# Patient Record
Sex: Female | Born: 1990 | Race: Black or African American | Hispanic: No | Marital: Single | Smoking: Never smoker
Health system: Southern US, Community
[De-identification: ages and names within clinical notes are randomized; demographics above are authoritative.]

## PROBLEM LIST (undated history)

## (undated) DIAGNOSIS — F458 Other somatoform disorders: Secondary | ICD-10-CM

## (undated) DIAGNOSIS — R1310 Dysphagia, unspecified: Secondary | ICD-10-CM

## (undated) DIAGNOSIS — M1990 Unspecified osteoarthritis, unspecified site: Secondary | ICD-10-CM

## (undated) DIAGNOSIS — R32 Unspecified urinary incontinence: Secondary | ICD-10-CM

## (undated) DIAGNOSIS — R198 Other specified symptoms and signs involving the digestive system and abdomen: Secondary | ICD-10-CM

## (undated) DIAGNOSIS — Z8669 Personal history of other diseases of the nervous system and sense organs: Secondary | ICD-10-CM

## (undated) DIAGNOSIS — K5900 Constipation, unspecified: Secondary | ICD-10-CM

## (undated) DIAGNOSIS — F840 Autistic disorder: Secondary | ICD-10-CM

## (undated) DIAGNOSIS — T8859XA Other complications of anesthesia, initial encounter: Secondary | ICD-10-CM

## (undated) DIAGNOSIS — F79 Unspecified intellectual disabilities: Secondary | ICD-10-CM

## (undated) DIAGNOSIS — J302 Other seasonal allergic rhinitis: Secondary | ICD-10-CM

## (undated) DIAGNOSIS — T4145XA Adverse effect of unspecified anesthetic, initial encounter: Secondary | ICD-10-CM

## (undated) HISTORY — PX: BELPHAROPTOSIS REPAIR: SHX369

## (undated) HISTORY — DX: Autistic disorder: F84.0

## (undated) HISTORY — DX: Unspecified osteoarthritis, unspecified site: M19.90

## (undated) HISTORY — PX: MUSCLE BIOPSY: SHX716

## (undated) HISTORY — PX: WISDOM TOOTH EXTRACTION: SHX21

## (undated) HISTORY — PX: TOENAIL EXCISION: SHX183

## (undated) SURGERY — Surgical Case
Anesthesia: *Unknown

---
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
## 2009-04-28 ENCOUNTER — Ambulatory Visit (HOSPITAL_BASED_OUTPATIENT_CLINIC_OR_DEPARTMENT_OTHER): Admission: RE | Admit: 2009-04-28 | Discharge: 2009-04-28 | Payer: Self-pay | Admitting: Oral Surgery

## 2009-04-28 HISTORY — PX: MULTIPLE TOOTH EXTRACTIONS: SHX2053

## 2009-07-29 ENCOUNTER — Encounter: Admission: RE | Admit: 2009-07-29 | Discharge: 2009-08-27 | Payer: Self-pay | Admitting: Pediatrics

## 2009-10-22 ENCOUNTER — Encounter: Admission: RE | Admit: 2009-10-22 | Discharge: 2009-11-17 | Payer: Self-pay | Admitting: Pediatrics

## 2011-01-10 NOTE — Op Note (Signed)
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   PROCEDURE:  Surgical removal of impacted teeth #1, #17, and #32.   ANESTHESIA:  General.   SURGEON:  Hinton Dyer, DDS   ASSISTANTS:  Montel Culver and Windy Kalata.   ESTIMATED BLOOD LOSS:  Less than 10 mL.   CONDITION IN THE SURGERY:  Good.   Following preoperative medication, the patient was brought to the  operating room in a supine position in which she remained throughout the  whole procedure.  She was intubated via right nasal endotracheal tube.  She was kept as per her physician as an I and O.  Once finished, she was  then prepped and draped in the usual fashion for an intraoral procedure.  The mouth and face were prepped with Betadine scrub and paint.  The  throat was packed off after suctioning it dry.  A bite block was used.  Three carpules of 2% Xylocaine with 1:100,000 epinephrine was given as  bilateral blocks and infiltration of the right maxilla.  Prior to doing  this, a lateral plate of each side of the face was taken so as to  visualize what the impacted teeth looked like.  The 2 lower impacted  teeth were horizontal impactions and the upper wound was a full bony  impaction on the right side.  No tooth was visualized on the upper left  side.  A 15 blade was then made an incision over the left impacted  wisdom tooth with a small release at the distal buccal aspect of tooth  #18.  A full-thickness mucoperiosteal flap was elevated with  a  periosteal elevator.  Occlusal buccal and distal bone was removed with a  round bur and copious irrigation.  The tooth was then suctioned  longitudinally with the same round bur.  It was split with an 11A  elevator.  It was then removed using the same 11A elevator.  The socket  was curetted and irrigated.  The soft tissue was repositioned and closed  with a 3-0 chromic suture.  A 15 blade then made an incision over the  right retromolar pad with a release at the distal buccal aspect of tooth  #31.  A full-thickness mucoperiosteal flap was elevated with a  periosteal elevator.  Occlusal buccal and distal bone was removed with a  round bur and copious irrigation.  The tooth was sectioned  longitudinally with a round bur and copious irrigation.  It was split  with an 11A elevator.  It was then removed with an 11A elevator.  The  socket was curetted and irrigated.  The soft tissue  was repositioned and  closed with a 3-0 chromic suture.  A 15 blade made an incision with the  right tuberosity extending around the tooth #2 tube and a full-thickness  mucoperiosteal flap was elevated with a periosteal elevator.  Occlusal  buccal and distal bone was removed with a rongeur.  The tooth was then  elevated out of the socket using an 11A elevator.  The socket was  curetted and irrigated.  The soft tissue was repositioned and closed  with a 3-0 chromic suture.  The mouth was irrigated out and suctioned.  Throat pack was  removed.  Gauze packs were placed over the surgical sites.  The patient  was extubated on the table and returned to the recovery room in good  condition.  She will be sent home with a prescription for Vicodin x20, 1-  2 q.4 h. p.r.n. pain and written home care and diet instructions.  Return visit 1-week observation.           ______________________________  Hinton Dyer, D.D.S.     JLM/MEDQ  D:  04/28/2009  T:  04/29/2009  Job:  [id number]

## 2011-01-13 NOTE — Procedures (Signed)
REQUESTING PHYSICIAN:  Dr. Ellison Carwin   CLINICAL HISTORY:  [age]-year-old girl with autism and periods of  unresponsiveness.  EEG is performed for evaluation of possible seizures.  This is a routine EEG done with photic stimulation but not hyperventilation.  The patient is described as awake.   DESCRIPTION:  The dominant rhythm of this tracing is an alpha rhythm of 10  Hz which predominates posteriorly and attenuates with eye opening and  closing, the alpha rhythm usually is symmetric in amplitude although  occasionally there is a marked asymmetry in the amplitude of the alpha much  higher on the left than on the right but there is no focal slowing  accompanying this, abundant low amplitude fast activity is seen throughout  the study without abnormal asymmetry.  No focal slowing is noted.  No  epileptiform discharges are seen.  Drowsiness occurs naturally as evidenced  by fragmentation of the background and generalized attenuation of rhythms  but no persistent drowsiness and no findings of stage II sleep are seen.  The patient did not cooperate very well with photic stimulation but there  did appear to be driving responses.  Hyperventilation was not performed.  Single channel devoted to EKG reveals sinus rhythm throughout with a rate of  approximately 96 beats per minute.   CONCLUSION:  Essentially normal study in the awake state.  Occasional  amplitude asymmetry of the background alpha rhythm in the left  frontotemporal area of questionable significance.  No focal slowing, no  epileptiform discharges.      GMW:NUUV  D:  10/11/2004 15:57:10  T:  10/11/2004 19:10:50  Job #:  [id number]

## 2011-07-11 DIAGNOSIS — K5900 Constipation, unspecified: Secondary | ICD-10-CM | POA: Insufficient documentation

## 2011-07-11 DIAGNOSIS — R350 Frequency of micturition: Secondary | ICD-10-CM | POA: Insufficient documentation

## 2011-07-11 DIAGNOSIS — R319 Hematuria, unspecified: Secondary | ICD-10-CM | POA: Insufficient documentation

## 2013-03-26 ENCOUNTER — Encounter: Payer: Self-pay | Admitting: Obstetrics & Gynecology

## 2013-03-26 ENCOUNTER — Ambulatory Visit (INDEPENDENT_AMBULATORY_CARE_PROVIDER_SITE_OTHER): Payer: Medicaid Other | Admitting: Obstetrics & Gynecology

## 2013-03-26 VITALS — BP 143/86 | HR 97 | Temp 97.7°F | Ht 65.0 in | Wt 132.0 lb

## 2013-03-26 DIAGNOSIS — N921 Excessive and frequent menstruation with irregular cycle: Secondary | ICD-10-CM

## 2013-03-26 MED ORDER — ESTRADIOL 0.1 MG/24HR TD PTWK: 1.0000 | MEDICATED_PATCH | TRANSDERMAL | Status: DC

## 2013-03-26 NOTE — Progress Notes (Signed)
Subjective:     Patricia Santos is a 22 y.o. female here for a routine exam.  Current complaints: Pt is currently on the Depo injection. Pt is having spotting while on the Depo. Pt's mother states the spotting starts a month before her next injection is due. Pt's mother states this began in Jan. 2014. Pt has been on the Depo for 8 years.   Personal health questionnaire reviewed: yes.   Gynecologic History No LMP recorded. Patient has had an injection. Contraception: Depo-Provera injections Last Pap: 2013. Results were: normal   Obstetric History OB History   Grav Para Term Preterm Abortions TAB SAB Ect Mult Living                   The following portions of the patient's history were reviewed and updated as appropriate: allergies, current medications, past family history, past medical history, past social history, past surgical history and problem list.  Review of Systems Review of systems not obtained due to patient factors.    Objective:     No exam today     Assessment:    Unscheduled bleeding on Depo provera Cognitive delays, hygiene issues w/menses  Plan:   Climara patch for now Placement of a Mirena IUD under Anesthesia

## 2013-03-26 NOTE — Patient Instructions (Signed)
Levonorgestrel intrauterine device (IUD) What is this medicine? LEVONORGESTREL IUD (LEE voe nor jes trel) is a contraceptive (birth control) device. The device is placed inside the uterus by a healthcare professional. It is used to prevent pregnancy and can also be used to treat heavy bleeding that occurs during your period. Depending on the device, it can be used for 3 to 5 years. This medicine may be used for other purposes; ask your health care provider or pharmacist if you have questions. What should I tell my health care provider before I take this medicine? They need to know if you have any of these conditions: -abnormal Pap smear -cancer of the breast, uterus, or cervix -diabetes -endometritis -genital or pelvic infection now or in the past -have more than one sexual partner or your partner has more than one partner -heart disease -history of an ectopic or tubal pregnancy -immune system problems -IUD in place -liver disease or tumor -problems with blood clots or take blood-thinners -use intravenous drugs -uterus of unusual shape -vaginal bleeding that has not been explained -an unusual or allergic reaction to levonorgestrel, other hormones, silicone, or polyethylene, medicines, foods, dyes, or preservatives -pregnant or trying to get pregnant -breast-feeding How should I use this medicine? This device is placed inside the uterus by a health care professional. Talk to your pediatrician regarding the use of this medicine in children. Special care may be needed. Overdosage: If you think you have taken too much of this medicine contact a poison control center or emergency room at once. NOTE: This medicine is only for you. Do not share this medicine with others. What if I miss a dose? This does not apply. What may interact with this medicine? Do not take this medicine with any of the following medications: -amprenavir -bosentan -fosamprenavir This medicine may also interact with  the following medications: -aprepitant -barbiturate medicines for inducing sleep or treating seizures -bexarotene -griseofulvin -medicines to treat seizures like carbamazepine, ethotoin, felbamate, oxcarbazepine, phenytoin, topiramate -modafinil -pioglitazone -rifabutin -rifampin -rifapentine -some medicines to treat HIV infection like atazanavir, indinavir, lopinavir, nelfinavir, tipranavir, ritonavir -St. John's wort -warfarin This list may not describe all possible interactions. Give your health care provider a list of all the medicines, herbs, non-prescription drugs, or dietary supplements you use. Also tell them if you smoke, drink alcohol, or use illegal drugs. Some items may interact with your medicine. What should I watch for while using this medicine? Visit your doctor or health care professional for regular check ups. See your doctor if you or your partner has sexual contact with others, becomes HIV positive, or gets a sexual transmitted disease. This product does not protect you against HIV infection (AIDS) or other sexually transmitted diseases. You can check the placement of the IUD yourself by reaching up to the top of your vagina with clean fingers to feel the threads. Do not pull on the threads. It is a good habit to check placement after each menstrual period. Call your doctor right away if you feel more of the IUD than just the threads or if you cannot feel the threads at all. The IUD may come out by itself. You may become pregnant if the device comes out. If you notice that the IUD has come out use a backup birth control method like condoms and call your health care provider. Using tampons will not change the position of the IUD and are okay to use during your period. What side effects may I notice from receiving this medicine?   Side effects that you should report to your doctor or health care professional as soon as possible: -allergic reactions like skin rash, itching or  hives, swelling of the face, lips, or tongue -fever, flu-like symptoms -genital sores -high blood pressure -no menstrual period for 6 weeks during use -pain, swelling, warmth in the leg -pelvic pain or tenderness -severe or sudden headache -signs of pregnancy -stomach cramping -sudden shortness of breath -trouble with balance, talking, or walking -unusual vaginal bleeding, discharge -yellowing of the eyes or skin Side effects that usually do not require medical attention (report to your doctor or health care professional if they continue or are bothersome): -acne -breast pain -change in sex drive or performance -changes in weight -cramping, dizziness, or faintness while the device is being inserted -headache -irregular menstrual bleeding within first 3 to 6 months of use -nausea This list may not describe all possible side effects. Call your doctor for medical advice about side effects. You may report side effects to FDA at 1-800-FDA-1088. Where should I keep my medicine? This does not apply. NOTE: This sheet is a summary. It may not cover all possible information. If you have questions about this medicine, talk to your doctor, pharmacist, or health care provider.  2013, Elsevier/Gold Standard. (09/14/2011 1:54:04 PM)  

## 2013-03-28 ENCOUNTER — Encounter: Payer: Self-pay | Admitting: Obstetrics & Gynecology

## 2013-03-28 ENCOUNTER — Other Ambulatory Visit: Payer: Self-pay | Admitting: *Deleted

## 2013-04-14 ENCOUNTER — Encounter (HOSPITAL_COMMUNITY): Payer: Self-pay

## 2013-04-14 NOTE — Progress Notes (Signed)
Pt. has autism mother will accompany her.  Also pt had eyelid surgery and her eyes will not close they will need to be taped

## 2013-04-24 ENCOUNTER — Encounter (HOSPITAL_COMMUNITY): Payer: Self-pay | Admitting: Pharmacy Technician

## 2013-05-02 ENCOUNTER — Ambulatory Visit (HOSPITAL_COMMUNITY): Payer: Medicaid Other | Admitting: Anesthesiology

## 2013-05-02 ENCOUNTER — Ambulatory Visit (HOSPITAL_COMMUNITY)
Admission: RE | Admit: 2013-05-02 | Discharge: 2013-05-02 | Disposition: A | Discharge status: Home / Self Care | Payer: Medicaid Other | Source: Ambulatory Visit | Attending: Obstetrics & Gynecology | Admitting: Obstetrics & Gynecology

## 2013-05-02 ENCOUNTER — Encounter (HOSPITAL_COMMUNITY)
Admission: RE | Disposition: A | Discharge status: Home / Self Care | Payer: Self-pay | Source: Ambulatory Visit | Attending: Obstetrics & Gynecology

## 2013-05-02 ENCOUNTER — Encounter (HOSPITAL_COMMUNITY): Payer: Self-pay | Admitting: Anesthesiology

## 2013-05-02 ENCOUNTER — Encounter (HOSPITAL_COMMUNITY): Payer: Self-pay | Admitting: *Deleted

## 2013-05-02 DIAGNOSIS — Z30430 Encounter for insertion of intrauterine contraceptive device: Secondary | ICD-10-CM | POA: Insufficient documentation

## 2013-05-02 DIAGNOSIS — N939 Abnormal uterine and vaginal bleeding, unspecified: Secondary | ICD-10-CM

## 2013-05-02 DIAGNOSIS — R6250 Unspecified lack of expected normal physiological development in childhood: Secondary | ICD-10-CM

## 2013-05-02 DIAGNOSIS — F840 Autistic disorder: Secondary | ICD-10-CM | POA: Insufficient documentation

## 2013-05-02 HISTORY — PX: INTRAUTERINE DEVICE (IUD) INSERTION: SHX5877

## 2013-05-02 SURGERY — INSERTION, INTRAUTERINE DEVICE
Anesthesia: Monitor Anesthesia Care | Site: Vagina | Wound class: Clean Contaminated

## 2013-05-02 MED ORDER — MIDAZOLAM HCL 5 MG/ML IJ SOLN: 2.0000 mg | Freq: Once | INTRAMUSCULAR | Status: DC

## 2013-05-02 MED ORDER — LACTATED RINGERS IV SOLN
INTRAVENOUS | Status: DC
  Administered 2013-05-02 (×3): via INTRAVENOUS

## 2013-05-02 MED ORDER — FENTANYL CITRATE 0.05 MG/ML IJ SOLN
INTRAMUSCULAR | Status: AC
  Filled 2013-05-02: qty 2

## 2013-05-02 MED ORDER — KETOROLAC TROMETHAMINE 15 MG/ML IJ SOLN
15.0000 mg | Freq: Four times a day (QID) | INTRAMUSCULAR | Status: DC
  Filled 2013-05-02: qty 1

## 2013-05-02 MED ORDER — KETAMINE HCL 10 MG/ML IJ SOLN
INTRAMUSCULAR | Status: DC | PRN
  Administered 2013-05-02: 20 mg via INTRAVENOUS
  Administered 2013-05-02: 10 mg via INTRAVENOUS
  Administered 2013-05-02: 20 mg via INTRAVENOUS
  Administered 2013-05-02: 10 mg via INTRAVENOUS
  Administered 2013-05-02 (×2): 20 mg via INTRAVENOUS

## 2013-05-02 MED ORDER — ACETAMINOPHEN 325 MG PO TABS: 650.0000 mg | ORAL_TABLET | ORAL | Status: DC | PRN

## 2013-05-02 MED ORDER — MIDAZOLAM HCL 2 MG/2ML IJ SOLN
INTRAMUSCULAR | Status: DC | PRN
  Administered 2013-05-02: 2 mg via INTRAVENOUS

## 2013-05-02 MED ORDER — LIDOCAINE HCL (CARDIAC) 20 MG/ML IV SOLN
INTRAVENOUS | Status: AC
  Filled 2013-05-02: qty 5

## 2013-05-02 MED ORDER — ONDANSETRON HCL 4 MG/2ML IJ SOLN
INTRAMUSCULAR | Status: AC
  Filled 2013-05-02: qty 2

## 2013-05-02 MED ORDER — MIDAZOLAM HCL 2 MG/2ML IJ SOLN
1.0000 mg | Freq: Once | INTRAMUSCULAR | Status: AC
  Administered 2013-05-02: 0.5 mg via INTRAVENOUS

## 2013-05-02 MED ORDER — DIAZEPAM 5 MG PO TABS
10.0000 mg | ORAL_TABLET | Freq: Once | ORAL | Status: AC
  Administered 2013-05-02: 10 mg via ORAL

## 2013-05-02 MED ORDER — FENTANYL CITRATE 0.05 MG/ML IJ SOLN: 25.0000 ug | INTRAMUSCULAR | Status: DC | PRN

## 2013-05-02 MED ORDER — MIDAZOLAM HCL 2 MG/2ML IJ SOLN
INTRAMUSCULAR | Status: AC
  Administered 2013-05-02: 1 mg
  Filled 2013-05-02: qty 2

## 2013-05-02 MED ORDER — MIDAZOLAM HCL 2 MG/2ML IJ SOLN
0.5000 mg | Freq: Once | INTRAMUSCULAR | Status: AC
  Administered 2013-05-02: 0.5 mg via INTRAVENOUS

## 2013-05-02 MED ORDER — MIDAZOLAM HCL 2 MG/2ML IJ SOLN
INTRAMUSCULAR | Status: AC
  Administered 2013-05-02: 0.5 mg via INTRAVENOUS
  Filled 2013-05-02: qty 2

## 2013-05-02 MED ORDER — ONDANSETRON HCL 4 MG/2ML IJ SOLN
INTRAMUSCULAR | Status: DC | PRN
  Administered 2013-05-02: 4 mg via INTRAVENOUS

## 2013-05-02 MED ORDER — MIDAZOLAM HCL 2 MG/2ML IJ SOLN
INTRAMUSCULAR | Status: AC
  Filled 2013-05-02: qty 2

## 2013-05-02 MED ORDER — KETAMINE HCL 10 MG/ML IJ SOLN
INTRAMUSCULAR | Status: AC
  Filled 2013-05-02: qty 1

## 2013-05-02 MED ORDER — ACETAMINOPHEN 650 MG RE SUPP
650.0000 mg | RECTAL | Status: DC | PRN
  Filled 2013-05-02: qty 1

## 2013-05-02 MED ORDER — MIDAZOLAM HCL 2 MG/2ML IJ SOLN
0.5000 mg | Freq: Once | INTRAMUSCULAR | Status: AC
  Administered 2013-05-02 (×2): 1 mg via INTRAVENOUS

## 2013-05-02 MED ORDER — OXYCODONE HCL 5 MG PO TABS: 5.0000 mg | ORAL_TABLET | ORAL | Status: DC | PRN

## 2013-05-02 MED ORDER — PROPOFOL 10 MG/ML IV EMUL
INTRAVENOUS | Status: AC
  Filled 2013-05-02: qty 20

## 2013-05-02 MED ORDER — MIDAZOLAM HCL 2 MG/2ML IJ SOLN
INTRAMUSCULAR | Status: AC
  Administered 2013-05-02: 1 mg via INTRAVENOUS
  Filled 2013-05-02: qty 2

## 2013-05-02 MED ORDER — FENTANYL CITRATE 0.05 MG/ML IJ SOLN
INTRAMUSCULAR | Status: DC | PRN
  Administered 2013-05-02: 100 ug via INTRAVENOUS

## 2013-05-02 SURGICAL SUPPLY — 14 items
CATH ROBINSON RED A/P 16FR (CATHETERS) ×2 IMPLANT
CLOTH BEACON ORANGE TIMEOUT ST (SAFETY) ×2 IMPLANT
CONTAINER PREFILL 10% NBF 60ML (FORM) ×4 IMPLANT
GLOVE BIO SURGEON STRL SZ 6.5 (GLOVE) ×4 IMPLANT
GOWN PREVENTION PLUS LG XLONG (DISPOSABLE) ×2 IMPLANT
GOWN STRL REIN XL XLG (GOWN DISPOSABLE) ×2 IMPLANT
NDL SPNL 22GX3.5 QUINCKE BK (NEEDLE) ×1 IMPLANT
NEEDLE SPNL 22GX3.5 QUINCKE BK (NEEDLE) ×2 IMPLANT
PACK VAGINAL MINOR WOMEN LF (CUSTOM PROCEDURE TRAY) ×2 IMPLANT
PAD PREP 24X48 CUFFED NSTRL (MISCELLANEOUS) ×2 IMPLANT
SYR CONTROL 10ML LL (SYRINGE) ×2 IMPLANT
TOWEL OR 17X24 6PK STRL BLUE (TOWEL DISPOSABLE) ×4 IMPLANT
WATER STERILE IRR 1000ML POUR (IV SOLUTION) ×2 IMPLANT
mirena ×1 IMPLANT

## 2013-05-02 NOTE — Anesthesia Preprocedure Evaluation (Signed)
Anesthesia Evaluation  Patient identified by MRN, date of birth, ID band Patient awake    Reviewed: Allergy & Precautions, H&P , Patient's Chart, lab work & pertinent test results, reviewed documented beta blocker date and time   Airway Mallampati: II TM Distance: >3 FB Neck ROM: full    Dental no notable dental hx.    Pulmonary  breath sounds clear to auscultation  Pulmonary exam normal       Cardiovascular Rhythm:regular Rate:Normal     Neuro/Psych    GI/Hepatic   Endo/Other    Renal/GU      Musculoskeletal   Abdominal   Peds  Hematology   Anesthesia Other Findings   Reproductive/Obstetrics                           Anesthesia Physical Anesthesia Plan  ASA: II  Anesthesia Plan: MAC   Post-op Pain Management:    Induction: Intravenous  Airway Management Planned: LMA, Mask and Natural Airway  Additional Equipment:   Intra-op Plan:   Post-operative Plan:   Informed Consent: I have reviewed the patients History and Physical, chart, labs and discussed the procedure including the risks, benefits and alternatives for the proposed anesthesia with the patient or authorized representative who has indicated his/her understanding and acceptance.   Dental Advisory Given  Plan Discussed with: CRNA and Surgeon  Anesthesia Plan Comments:         Anesthesia Quick Evaluation  

## 2013-05-02 NOTE — Anesthesia Postprocedure Evaluation (Signed)
  Anesthesia Post-op Note  Patient: Patricia Santos  Procedure(s) Performed: Procedure(s): INTRAUTERINE DEVICE (IUD) INSERTION (N/A) Patient is awake and responsive. Mother present at bedside. Pain and nausea are reasonably well controlled. Vital signs are stable and clinically acceptable. Oxygen saturation is clinically acceptable. There are no apparent anesthetic complications at this time. Patient is ready for discharge.

## 2013-05-02 NOTE — Transfer of Care (Signed)
Immediate Anesthesia Transfer of Care Note  Patient: Patricia Santos  Procedure(s) Performed: Procedure(s): INTRAUTERINE DEVICE (IUD) INSERTION (N/A)  Patient Location: PACU  Anesthesia Type:MAC  Level of Consciousness: sedated  Airway & Oxygen Therapy: Patient Spontanous Breathing  Post-op Assessment: Report given to PACU RN  Post vital signs: Reviewed and stable  Complications: No apparent anesthesia complications

## 2013-05-02 NOTE — H&P (Signed)
  Chief Complaint: 22 y.o.  who presents for an IUD placement  Details of Present Illness: The patient has significant cognitive delays.  She is currently being managed with Depo provera to induce an amenorrheic state to help with hygiene issues during menses.  She is having bleeding with the Depo provera.  Ht 5\' 4"  (1.626 m)  Wt 130 lb (58.968 kg)  BMI 22.3 kg/m2  Past Medical History  Diagnosis Date  . Autistic disorder   . Seizures     none in 5-6 yrs  . Arthritis     feet   History   Social History  . Marital Status: Single    Spouse Name: N/A    Number of Children: N/A  . Years of Education: N/A   Occupational History  . Not on file.   Social History Main Topics  . Smoking status: Never Smoker   . Smokeless tobacco: Not on file  . Alcohol Use: No  . Drug Use: No  . Sexual Activity: No   Other Topics Concern  . Not on file   Social History Narrative  . No narrative on file   Family History  Problem Relation Age of Onset  . Hyperthyroidism Father   . Hyperthyroidism Maternal Aunt   . Diabetes Maternal Grandmother   . Hypertension Maternal Grandmother     Pertinent items are noted in HPI.  Pre-Op Diagnosis: cognitive delays/hygene issues [id number] modifier 23   Planned Procedure: Procedure(s): INTRAUTERINE DEVICE (IUD) INSERTION, EUA, Pap smear  I have reviewed the patient's history and have completed the physical exam and Patricia Santos is acceptable for surgery.  Roseanna Rainbow, MD 05/02/2013 11:11 AM

## 2013-05-02 NOTE — Op Note (Signed)
IUD Insertion Procedure Note  Pre-operative Diagnosis: Cognitive delays, hygiene issues with menses  Post-operative Diagnosis: same  Indications: See above  Procedure Details  The risks (including infection, bleeding, pain, and uterine perforation) and benefits of the procedure were explained to the patient's guardian and Written informed consent was obtained.   A Pap smear was obtained.  An exam under anesthesia was performed.  The uterus was nonpalpable and there were no palpable adnexal masses. Cervix cleansed with Betadine. Uterus sounded to 6 cm. IUD inserted without difficulty. String visible and trimmed. Patient tolerated procedure well.  IUD Information: Mirena.  Condition: Stable  Complications: None  Plan:  The patient was advised to call for any fever or for prolonged or severe pain or bleeding. She was advised to use NSAID as needed for mild to moderate pain.

## 2013-05-08 ENCOUNTER — Encounter (HOSPITAL_COMMUNITY): Payer: Self-pay | Admitting: Obstetrics & Gynecology

## 2013-06-04 ENCOUNTER — Ambulatory Visit (INDEPENDENT_AMBULATORY_CARE_PROVIDER_SITE_OTHER): Payer: Medicaid Other | Admitting: Obstetrics & Gynecology

## 2013-06-04 ENCOUNTER — Encounter: Payer: Self-pay | Admitting: Obstetrics & Gynecology

## 2013-06-04 VITALS — BP 116/76 | HR 109 | Temp 97.9°F | Wt 135.6 lb

## 2013-06-04 DIAGNOSIS — Z30431 Encounter for routine checking of intrauterine contraceptive device: Secondary | ICD-10-CM

## 2013-06-04 DIAGNOSIS — Z09 Encounter for follow-up examination after completed treatment for conditions other than malignant neoplasm: Secondary | ICD-10-CM

## 2013-06-04 NOTE — Progress Notes (Signed)
Subjective:    Patricia Santos is a 22 y.o. female who presents for f/u s/p Mirena IUD placement Menstrual History: OB History   Grav Para Term Preterm Abortions TAB SAB Ect Mult Living                     The following portions of the patient's history were reviewed and updated as appropriate: allergies, current medications, past family history, past medical history, past social history and past surgical history.  Review of Systems Pertinent items are noted in HPI.   Objective:     No exam today.  Informal TAS: hyperechoic IUD seen     Assessment:    22 y.o. cognitive delays, hygiene issues with menses now s/p Mirena IUD placement  Plan:   F/U prn or in 1 yr

## 2013-08-08 ENCOUNTER — Ambulatory Visit (INDEPENDENT_AMBULATORY_CARE_PROVIDER_SITE_OTHER): Payer: Medicaid Other

## 2013-08-08 VITALS — BP 128/78 | HR 76 | Resp 12 | Ht 64.0 in | Wt 130.0 lb

## 2013-08-08 DIAGNOSIS — M7750 Other enthesopathy of unspecified foot: Secondary | ICD-10-CM

## 2013-08-08 DIAGNOSIS — M2030 Hallux varus (acquired), unspecified foot: Secondary | ICD-10-CM

## 2013-08-08 DIAGNOSIS — R269 Unspecified abnormalities of gait and mobility: Secondary | ICD-10-CM

## 2013-08-08 DIAGNOSIS — M778 Other enthesopathies, not elsewhere classified: Secondary | ICD-10-CM

## 2013-08-08 MED ORDER — DICLOFENAC EPOLAMINE 1.3 % TD PTCH: 1.0000 | MEDICATED_PATCH | Freq: Every day | TRANSDERMAL | Status: DC

## 2013-08-08 NOTE — Patient Instructions (Signed)
Osteoarthritis Osteoarthritis is the most common form of arthritis. It is redness, soreness, and swelling (inflammation) affecting the cartilage. Cartilage acts as a cushion, covering the ends of bones where they meet to form a joint. CAUSES  Over time, the cartilage begins to wear away. This causes bone to rub on bone. This produces pain and stiffness in the affected joints. Factors that contribute to this problem are:  Excessive body weight.  Age.  Overuse of joints. SYMPTOMS   People with osteoarthritis usually experience joint pain, swelling, or stiffness.  Over time, the joint may lose its normal shape.  Small deposits of bone (osteophytes) may grow on the edges of the joint.  Bits of bone or cartilage can break off and float inside the joint space. This may cause more pain and damage.  Osteoarthritis can lead to depression, anxiety, feelings of helplessness, and limitations on daily activities. The most commonly affected joints are in the:  Ends of the fingers.  Thumbs.  Neck.  Lower back.  Knees.  Hips. DIAGNOSIS  Diagnosis is mostly based on your symptoms and exam. Tests may be helpful, including:  X-rays of the affected joint.  A computerized magnetic scan (MRI).  Blood tests to rule out other types of arthritis.  Joint fluid tests. This involves using a needle to draw fluid from the joint and examining the fluid under a microscope. TREATMENT  Goals of treatment are to control pain, improve joint function, maintain a normal body weight, and maintain a healthy lifestyle. Treatment approaches may include:  A prescribed exercise program with rest and joint relief.  Weight control with nutritional education.  Pain relief techniques such as:  Properly applied heat and cold.  Electric pulses delivered to nerve endings under the skin (transcutaneous electrical nerve stimulation, TENS).  Massage.  Certain supplements. Ask your caregiver before using any  supplements, especially in combination with prescribed drugs.  Medicines to control pain, such as:  Acetaminophen.  Nonsteroidal anti-inflammatory drugs (NSAIDs), such as naproxen.  Narcotic or central-acting agents, such as tramadol. This drug carries a risk of addiction and is generally prescribed for short-term use.  Corticosteroids. These can be given orally or as injection. This is a short-term treatment, not recommended for routine use.  Surgery to reposition the bones and relieve pain (osteotomy) or to remove loose pieces of bone and cartilage. Joint replacement may be needed in advanced states of osteoarthritis. HOME CARE INSTRUCTIONS  Your caregiver can recommend specific types of exercise. These may include:  Strengthening exercises. These are done to strengthen the muscles that support joints affected by arthritis. They can be performed with weights or with exercise bands to add resistance.  Aerobic activities. These are exercises, such as brisk walking or low-impact aerobics, that get your heart pumping. They can help keep your lungs and circulatory system in shape.  Range-of-motion activities. These keep your joints limber.  Balance and agility exercises. These help you maintain daily living skills. Learning about your condition and being actively involved in your care will help improve the course of your osteoarthritis. SEEK MEDICAL CARE IF:   You feel hot or your skin turns red.  You develop a rash in addition to your joint pain.  You have an oral temperature above 102 F (38.9 C). FOR MORE INFORMATION  National Institute of Arthritis and Musculoskeletal and Skin Diseases: www.niams.nih.gov National Institute on Aging: www.nia.nih.gov American College of Rheumatology: www.rheumatology.org Document Released: 08/14/2005 Document Revised: 11/06/2011 Document Reviewed: 11/25/2009 Exit[employer] Patient Information 2014 [employer].  

## 2013-08-08 NOTE — Progress Notes (Signed)
   Subjective:    Patient ID: Patricia Santos, female    DOB: 01-17-1991, 22 y.o.   MRN: [id number]  Foot Pain This is a chronic problem. The current episode started more than 1 year ago. The problem occurs daily. The problem has been unchanged. Associated symptoms include weakness. The symptoms are aggravated by walking and standing. Treatments tried: PATCH.      Review of Systems  Neurological: Positive for weakness.  All other systems reviewed and are negative.       Objective:   Physical Exam Neurovascular status is intact patient does have some spasm secondary CP. Pedal pulses are palpable bilateral no edema rubor pallor noted patient is discomfort on dorsiflexion plantarflexion and digits which are contracted has hallux malleus contractures well and gait abnormality with spastic contractures of the ankles. Currently is wearing and athletic shoes and not utilizing bracing. Continues to have diffuse pain capsulitis metatarsal area as well as mid tarsus and Lisfranc area. Significant for 3 changes of the foot noted with has valgus deformity and pes planus. No other findings noted skin color pigment and hair growth are normal no open sores or ulcerations noted. No other medical difficulties any changes no GI problems has been using factor patches successfully and recommend continued use of flexor patch for mild os arthritic pain.       Assessment & Plan:  Assessment abnormality gait osteoarthropathy and neuropathy secondary CP there is capsulitis and digital contractures of toes and hallux bilateral. Plan at this time dispensed reordered 4 Rx of flexor patch PT apply half attached each foot for 12 hours a day reappointed 12 months for long-term followup. Also maintain good lace up or Oxford type shoe wear at all times. Next  Alvan Dame DPM

## 2013-08-18 ENCOUNTER — Telehealth: Payer: Self-pay | Admitting: *Deleted

## 2013-08-18 NOTE — Telephone Encounter (Signed)
I informed pt's mtr,prior authorization had been received for the Flector 1.3 % patch - ZO#[id number].

## 2014-04-03 ENCOUNTER — Encounter (HOSPITAL_BASED_OUTPATIENT_CLINIC_OR_DEPARTMENT_OTHER): Payer: Self-pay | Admitting: *Deleted

## 2014-04-08 ENCOUNTER — Ambulatory Visit (HOSPITAL_BASED_OUTPATIENT_CLINIC_OR_DEPARTMENT_OTHER): Payer: Medicaid Other | Admitting: Certified Registered"

## 2014-04-08 ENCOUNTER — Ambulatory Visit (HOSPITAL_BASED_OUTPATIENT_CLINIC_OR_DEPARTMENT_OTHER)
Admission: RE | Admit: 2014-04-08 | Discharge: 2014-04-08 | Disposition: A | Discharge status: Home / Self Care | Payer: Medicaid Other | Source: Ambulatory Visit | Attending: Pediatric Dentistry | Admitting: Pediatric Dentistry

## 2014-04-08 ENCOUNTER — Encounter (HOSPITAL_BASED_OUTPATIENT_CLINIC_OR_DEPARTMENT_OTHER)
Admission: RE | Disposition: A | Discharge status: Home / Self Care | Payer: Self-pay | Source: Ambulatory Visit | Attending: Pediatric Dentistry

## 2014-04-08 ENCOUNTER — Encounter (HOSPITAL_BASED_OUTPATIENT_CLINIC_OR_DEPARTMENT_OTHER): Payer: Self-pay

## 2014-04-08 ENCOUNTER — Encounter (HOSPITAL_BASED_OUTPATIENT_CLINIC_OR_DEPARTMENT_OTHER): Payer: Medicaid Other | Admitting: Certified Registered"

## 2014-04-08 DIAGNOSIS — K029 Dental caries, unspecified: Secondary | ICD-10-CM | POA: Insufficient documentation

## 2014-04-08 HISTORY — DX: Other seasonal allergic rhinitis: J30.2

## 2014-04-08 HISTORY — DX: Other somatoform disorders: F45.8

## 2014-04-08 HISTORY — DX: Adverse effect of unspecified anesthetic, initial encounter: T41.45XA

## 2014-04-08 HISTORY — DX: Other complications of anesthesia, initial encounter: T88.59XA

## 2014-04-08 HISTORY — DX: Unspecified intellectual disabilities: F79

## 2014-04-08 HISTORY — PX: TOOTH EXTRACTION: SHX859

## 2014-04-08 HISTORY — DX: Dysphagia, unspecified: R13.10

## 2014-04-08 HISTORY — DX: Constipation, unspecified: K59.00

## 2014-04-08 HISTORY — DX: Personal history of other diseases of the nervous system and sense organs: Z86.69

## 2014-04-08 HISTORY — DX: Other specified symptoms and signs involving the digestive system and abdomen: R19.8

## 2014-04-08 SURGERY — DENTAL RESTORATION/EXTRACTIONS
Anesthesia: General | Site: Mouth

## 2014-04-08 MED ORDER — MIDAZOLAM HCL 2 MG/2ML IJ SOLN
INTRAMUSCULAR | Status: AC
  Filled 2014-04-08: qty 2

## 2014-04-08 MED ORDER — PROPOFOL 10 MG/ML IV EMUL
INTRAVENOUS | Status: AC
  Filled 2014-04-08: qty 50

## 2014-04-08 MED ORDER — LACTATED RINGERS IV SOLN
INTRAVENOUS | Status: DC
  Administered 2014-04-08 (×2): via INTRAVENOUS

## 2014-04-08 MED ORDER — PROPOFOL 10 MG/ML IV BOLUS
INTRAVENOUS | Status: DC | PRN
  Administered 2014-04-08: 20 mg via INTRAVENOUS
  Administered 2014-04-08: 30 mg via INTRAVENOUS
  Administered 2014-04-08 (×3): 100 mg via INTRAVENOUS

## 2014-04-08 MED ORDER — MIDAZOLAM HCL 2 MG/ML PO SYRP: 12.0000 mg | ORAL_SOLUTION | Freq: Once | ORAL | Status: DC | PRN

## 2014-04-08 MED ORDER — DEXAMETHASONE SODIUM PHOSPHATE 4 MG/ML IJ SOLN
INTRAMUSCULAR | Status: DC | PRN
  Administered 2014-04-08: 10 mg via INTRAVENOUS

## 2014-04-08 MED ORDER — ONDANSETRON HCL 4 MG/2ML IJ SOLN
INTRAMUSCULAR | Status: DC | PRN
  Administered 2014-04-08: 4 mg via INTRAVENOUS

## 2014-04-08 MED ORDER — LIDOCAINE-EPINEPHRINE 2 %-1:100000 IJ SOLN
INTRAMUSCULAR | Status: DC | PRN
  Administered 2014-04-08: 1 mL

## 2014-04-08 MED ORDER — MIDAZOLAM HCL 5 MG/5ML IJ SOLN
INTRAMUSCULAR | Status: DC | PRN
  Administered 2014-04-08: 2 mg via INTRAVENOUS

## 2014-04-08 MED ORDER — EPHEDRINE SULFATE 50 MG/ML IJ SOLN
INTRAMUSCULAR | Status: DC | PRN
  Administered 2014-04-08: 10 mg via INTRAVENOUS

## 2014-04-08 MED ORDER — FENTANYL CITRATE 0.05 MG/ML IJ SOLN
INTRAMUSCULAR | Status: DC | PRN
  Administered 2014-04-08 (×4): 25 ug via INTRAVENOUS

## 2014-04-08 MED ORDER — FENTANYL CITRATE 0.05 MG/ML IJ SOLN
INTRAMUSCULAR | Status: AC
  Filled 2014-04-08: qty 4

## 2014-04-08 MED ORDER — FENTANYL CITRATE 0.05 MG/ML IJ SOLN: 50.0000 ug | INTRAMUSCULAR | Status: DC | PRN

## 2014-04-08 MED ORDER — MIDAZOLAM HCL 2 MG/2ML IJ SOLN: 1.0000 mg | INTRAMUSCULAR | Status: DC | PRN

## 2014-04-08 MED ORDER — PROPOFOL 10 MG/ML IV BOLUS
INTRAVENOUS | Status: AC
  Filled 2014-04-08: qty 20

## 2014-04-08 SURGICAL SUPPLY — 22 items
BANDAGE COBAN STERILE 2 (GAUZE/BANDAGES/DRESSINGS) IMPLANT
BANDAGE EYE OVAL (MISCELLANEOUS) ×2 IMPLANT
BLADE SURG 15 STRL LF DISP TIS (BLADE) IMPLANT
BLADE SURG 15 STRL SS (BLADE)
BNDG CONFORM 2 STRL LF (GAUZE/BANDAGES/DRESSINGS) ×2 IMPLANT
BRR OPER DNTL INFCT CNTRL SYR (MISCELLANEOUS) ×1
CANISTER SUCT 1200ML W/VALVE (MISCELLANEOUS) ×2 IMPLANT
CATH ROBINSON RED A/P 10FR (CATHETERS) IMPLANT
CATH ROBINSON RED A/P 8FR (CATHETERS) IMPLANT
COVER MAYO STAND STRL (DRAPES) ×2 IMPLANT
COVER SLEEVE SYR LF (MISCELLANEOUS) ×2 IMPLANT
COVER SURGICAL LIGHT HANDLE (MISCELLANEOUS) ×2 IMPLANT
GLOVE BIO SURGEON STRL SZ 6 (GLOVE) IMPLANT
GLOVE BIO SURGEON STRL SZ 6.5 (GLOVE) ×2 IMPLANT
GLOVE BIO SURGEON STRL SZ7.5 (GLOVE) ×3 IMPLANT
SLEEVE SCD COMPRESS KNEE MED (MISCELLANEOUS) ×1 IMPLANT
SUCTION FRAZIER TIP 10 FR DISP (SUCTIONS) IMPLANT
TOWEL OR 17X24 6PK STRL BLUE (TOWEL DISPOSABLE) ×2 IMPLANT
TUBE CONNECTING 20X1/4 (TUBING) ×2 IMPLANT
WATER STERILE IRR 1000ML POUR (IV SOLUTION) ×2 IMPLANT
WATER TABLETS ICX (MISCELLANEOUS) ×2 IMPLANT
YANKAUER SUCT BULB TIP NO VENT (SUCTIONS) ×2 IMPLANT

## 2014-04-08 NOTE — Anesthesia Postprocedure Evaluation (Signed)
  Anesthesia Post-op Note  Patient: Patricia Santos  Procedure(s) Performed: Procedure(s): DENTAL RESTORATION/EXTRACTIONS (N/A)  Patient Location: PACU  Anesthesia Type:General  Level of Consciousness: awake  Airway and Oxygen Therapy: Patient Spontanous Breathing  Post-op Pain: mild  Post-op Assessment: Post-op Vital signs reviewed, Patient's Cardiovascular Status Stable, Respiratory Function Stable, Patent Airway, No signs of Nausea or vomiting and Pain level controlled  Post-op Vital Signs: Reviewed and stable  Last Vitals:  Filed Vitals:   04/08/14 1115  BP: 118/68  Pulse: 98  Temp: 36.4 C  Resp: 16    Complications: No apparent anesthesia complications

## 2014-04-08 NOTE — Discharge Instructions (Signed)
The following instructions have been prepared to help you care for yourself upon your return home today.  Medications: Some soreness and discomfort is normal following a dental procedure. Use of a non-aspirin pain product is recommended. If pain is not relieved, please call the dentist who performed the procedure.  Oral Hygiene: Brushing of the teeth should be resumed the day after surgery. Begin slowly and softly. In children, brushing should be done by the parent after every meal.  Diet: A balanced diet is very important during the healing process. Liquids and soft foods are advisable. Drink clear liquids at first, then progress to other liquids as tolerated. If teeth were removed, do not use a straw for at least 2 days. Try to limit between meal sugar snacks. . Some bleeding is expected at extraction site.  Have child bite on gauze and hold pressure in area until it stops.  No drinking through a straw for 24hrs.  Activity: Limited to quiet indoor activities for 24 hours following surgery.  Return to school or work:In a day or two                                         Call your doctor if any of these occur: Temperature is 101 degrees or more.                                                               Persistent bright red bleeding.                                                               Severe pain.  Return to Office: Call to set up appointment:          Post Anesthesia Home Care Instructions  Activity: Get plenty of rest for the remainder of the day. A responsible adult should stay with you for 24 hours following the procedure.  For the next 24 hours, DO NOT: -Drive a car -Paediatric nurse -Drink alcoholic beverages -Take any medication unless instructed by your physician -Make any legal decisions or sign important papers.  Meals: Start with liquid foods such as gelatin or soup. Progress to regular foods as tolerated. Avoid greasy, spicy, heavy foods. If  nausea and/or vomiting occur, drink only clear liquids until the nausea and/or vomiting subsides. Call your physician if vomiting continues.  Special Instructions/Symptoms: Your throat may feel dry or sore from the anesthesia or the breathing tube placed in your throat during surgery. If this causes discomfort, gargle with warm salt water. The discomfort should disappear within 24 hours.

## 2014-04-08 NOTE — Brief Op Note (Addendum)
04/08/2014  10:43 AM  PATIENT:  Patricia Santos  23 y.o. female  PRE-OPERATIVE DIAGNOSIS:  dental caries  POST-OPERATIVE DIAGNOSIS:  dental caries  PROCEDURE:  Procedure(s): DENTAL RESTORATION/EXTRACTIONS (N/A)  SURGEON:  Surgeon(s) and Role:    * Animator, DDS - Primary  PHYSICIAN ASSISTANT:   ASSISTANTS: Mariane Duval, Alda Ponder   ANESTHESIA:   general  EBL:  Total I/O In: 1200 [I.V.:1200] Out: -   BLOOD ADMINISTERED:none  DRAINS: none   LOCAL MEDICATIONS USED:  LIDOCAINE   SPECIMEN:  Source of Specimen:  one tooth for count only  DISPOSITION OF SPECIMEN:  One tooth for count only given to mother  COUNTS:  YES  TOURNIQUET:  * No tourniquets in log *  DICTATION: .Other Dictation: Dictation Number [id number]  PLAN OF CARE: Discharge to home after PACU  PATIENT DISPOSITION:  PACU - hemodynamically stable.   Delay start of Pharmacological VTE agent (>24hrs) due to surgical blood loss or risk of bleeding: not applicable

## 2014-04-08 NOTE — H&P (Signed)
  Physical by general Physician and is in chart.  Reviewed allergies and answered parent questions.

## 2014-04-08 NOTE — Transfer of Care (Signed)
Immediate Anesthesia Transfer of Care Note  Patient: Patricia Santos  Procedure(s) Performed: Procedure(s): DENTAL RESTORATION/EXTRACTIONS (N/A)  Patient Location: PACU  Anesthesia Type:General  Level of Consciousness: awake, alert  and oriented  Airway & Oxygen Therapy: Patient Spontanous Breathing and Patient connected to face mask oxygen  Post-op Assessment: Report given to PACU RN and Post -op Vital signs reviewed and stable  Post vital signs: Reviewed and stable  Complications: No apparent anesthesia complications

## 2014-04-08 NOTE — Anesthesia Procedure Notes (Signed)
Procedure Name: Intubation Date/Time: 04/08/2014 8:38 AM Performed by: Maryella Shivers Pre-anesthesia Checklist: Patient identified, Emergency Drugs available, Suction available and Patient being monitored Patient Re-evaluated:Patient Re-evaluated prior to inductionOxygen Delivery Method: Circle System Utilized Preoxygenation: Pre-oxygenation with 100% oxygen Intubation Type: IV induction Ventilation: Mask ventilation without difficulty Laryngoscope Size: Mac and 3 Grade View: Grade I Nasal Tubes: Nasal prep performed, Nasal Rae and Right Tube size: 6.5 mm Number of attempts: 1 Placement Confirmation: ETT inserted through vocal cords under direct vision,  positive ETCO2 and breath sounds checked- equal and bilateral Secured at: 27 cm Tube secured with: Tape Dental Injury: Teeth and Oropharynx as per pre-operative assessment

## 2014-04-08 NOTE — Anesthesia Preprocedure Evaluation (Signed)
Anesthesia Evaluation  Patient identified by MRN, date of birth, ID band Patient awake    Reviewed: Allergy & Precautions, H&P , NPO status , Patient's Chart, lab work & pertinent test results  History of Anesthesia Complications Negative for: history of anesthetic complications  Airway Mallampati: I TM Distance: >3 FB Neck ROM: Full    Dental  (+) Teeth Intact   Pulmonary neg pulmonary ROS,  breath sounds clear to auscultation        Cardiovascular negative cardio ROS  Rhythm:Regular     Neuro/Psych autismnegative neurological ROS     GI/Hepatic negative GI ROS, Neg liver ROS,   Endo/Other  negative endocrine ROS  Renal/GU negative Renal ROS     Musculoskeletal   Abdominal   Peds  Hematology negative hematology ROS (+)   Anesthesia Other Findings   Reproductive/Obstetrics                           Anesthesia Physical Anesthesia Plan  ASA: II  Anesthesia Plan: General   Post-op Pain Management:    Induction: Intravenous  Airway Management Planned: Oral ETT and Nasal ETT  Additional Equipment: None  Intra-op Plan:   Post-operative Plan: Extubation in OR  Informed Consent: I have reviewed the patients History and Physical, chart, labs and discussed the procedure including the risks, benefits and alternatives for the proposed anesthesia with the patient or authorized representative who has indicated his/her understanding and acceptance.   Dental advisory given  Plan Discussed with: CRNA and Surgeon  Anesthesia Plan Comments:         Anesthesia Quick Evaluation

## 2014-04-09 ENCOUNTER — Encounter (HOSPITAL_BASED_OUTPATIENT_CLINIC_OR_DEPARTMENT_OTHER): Payer: Self-pay | Admitting: Pediatric Dentistry

## 2014-04-09 NOTE — Op Note (Signed)
Patricia Santos, Patricia Santos              ACCOUNT NO.:  [id number]  MEDICAL RECORD NO.:  [id number]  LOCATION:                                 FACILITY:  PHYSICIAN:  [place]:  10/13/1990  DATE OF PROCEDURE:  04/08/2014 DATE OF DISCHARGE:  04/08/2014                              OPERATIVE REPORT   PREOPERATIVE DIAGNOSIS:  A well-child acute anxiety reaction to dental Treatment with autism.  POSTOPERATIVE DIAGNOSIS:  A well-child acute anxiety reaction to dental treatment with autism.  PROCEDURE PERFORMED:  Full-mouth dental rehabilitation.  SURGEONS:  Doroteo Glassman, DDS, MS.  ASSISTANT:  Mariane Duval and Alda Ponder.  SPECIMENS:  One tooth for count only given to mother.  DRAINS:  None.  CULTURES:  None.  BLOOD LOSS:  Less than 5 mL.  DESCRIPTION OF PROCEDURE:  The patient had IV started in the preoperative area.  The patient then received 2 mL of Versed through the IV and then 100 mL of propofol in the IV.  The patient was then brought from the preoperative area to the operating room, and was placed in a supine position on the operating table.  General anesthesia was induced by mask.  Direct nasoendotracheal intubation was established with a size 6.5 nasal Rae tube.  The head was stabilized and the eyes were protected with lubricant eye pads.  The table was turned 90 degrees.  Four intraoral radiographs were obtained.  A throat pack was placed.  The treatment plan was confirmed and the dental treatment began at 8:52 a.m. The dental arches were isolated with the rubber dam and the following teeth were restored.  Tooth #2, an occlusal composite resin.  Tooth #18, sealant.  Tooth #20, an occlusal composite.  Tooth #21, an occlusal composite.  Tooth #28, an occlusal composite.  Tooth #29, an occlusal composite.  Tooth #31, an occlusal composite.  The rubber dam was removed and the mouth was thoroughly irrigated.  Tooth #15, had large caries to the  pulp.  Due to the patient's autism and being unable to place a crown in the office, the mother was called and asked in the waiting room if extraction was okay.  The mother Laurie Panda the extraction.  Tooth #16, the 3rd molar was erupting and will hopefully take the place of tooth #15 that was removed with forceps with 1.0 mL of 2% lidocaine with 1:100,000 epinephrine.  The mouth was thoroughly irrigated.  The throat pack was removed and the throat was suctioned.  The patient was extubated in the operating room.  The end of the dental treatment was at 10:27 a.m. The patient tolerated the procedures well and was taken to the PACU in stable condition with IV in place.     Doroteo Glassman, D.D.S.     [place]/MEDQ  D:  04/08/2014  T:  04/08/2014  Job:  [id number]

## 2014-08-09 ENCOUNTER — Other Ambulatory Visit: Payer: Self-pay

## 2014-08-14 ENCOUNTER — Ambulatory Visit: Payer: Medicaid Other

## 2014-08-18 ENCOUNTER — Ambulatory Visit (INDEPENDENT_AMBULATORY_CARE_PROVIDER_SITE_OTHER): Payer: Medicaid Other

## 2014-08-18 VITALS — BP 121/76 | HR 86 | Resp 12 | Ht 63.0 in | Wt 143.0 lb

## 2014-08-18 DIAGNOSIS — M778 Other enthesopathies, not elsewhere classified: Secondary | ICD-10-CM

## 2014-08-18 DIAGNOSIS — L600 Ingrowing nail: Secondary | ICD-10-CM

## 2014-08-18 DIAGNOSIS — R269 Unspecified abnormalities of gait and mobility: Secondary | ICD-10-CM

## 2014-08-18 DIAGNOSIS — M779 Enthesopathy, unspecified: Principal | ICD-10-CM

## 2014-08-18 DIAGNOSIS — M7750 Other enthesopathy of unspecified foot: Secondary | ICD-10-CM

## 2014-08-18 DIAGNOSIS — M2040 Other hammer toe(s) (acquired), unspecified foot: Secondary | ICD-10-CM

## 2014-08-18 DIAGNOSIS — M2030 Hallux varus (acquired), unspecified foot: Secondary | ICD-10-CM

## 2014-08-18 MED ORDER — DICLOFENAC EPOLAMINE 1.3 % TD PTCH: MEDICATED_PATCH | TRANSDERMAL | Status: DC

## 2014-08-18 NOTE — Progress Notes (Signed)
   Subjective:    Patient ID: Patricia Santos, female    DOB: [birth year], 23 y.o.   MRN: [id number]  HPI Comments: Pt's mtr states the pt continues to get good pain relief using the Flextor patches, without any complications.  Pt's mtr states the pt does complain of painful toenails B/L medial 1st toenail borders, on and off.  Foot Pain Associated symptoms include congestion and headaches.      Review of Systems  HENT: Positive for congestion.   Eyes: Positive for itching.  Allergic/Immunologic: Positive for food allergies.       Bananas and milk products  Neurological: Positive for headaches.       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 Is intact neurovascular status with pedal pulses palpable DP and PT +2 over 4 Refill time 3 seconds all digits epicritic and proprioceptive sensations intact although somewhat difficult to ascertain with patient's complaints patient does have some widening and splaying of the hallux nail plate try to cut the corners nails may be cut to short to cutting the nail straight across rather than curving in the course to avoid ingrowing nail or brachial nail borders.       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 We'll continue in refill prescription for Flector patches issued at this time  Patient also has mother presented a concern about occasional tenderness in the nail borders however nails are cut too short and rounding a corner is advised left the nails grow straight across and avoiding cutting tight in the corners  along throughout slightly if they continue be persistently problem may be candidate for future partial nail excision based on progress suggest a 1 year annual checkup  Harriet Masson DPM

## 2014-08-18 NOTE — Patient Instructions (Signed)
ICE INSTRUCTIONS  Apply ice or cold pack to the affected area at least 3 times a day for 10-15 minutes each time.  You should also use ice after prolonged activity or vigorous exercise.  Do not apply ice longer than 20 minutes at one time.  Always keep a cloth between your skin and the ice pack to prevent burns.  Being consistent and following these instructions will help control your symptoms.  We suggest you purchase a gel ice pack because they are reusable and do bit leak.  Some of them are designed to wrap around the area.  Use the method that works best for you.  Here are some other suggestions for icing.   Use a frozen bag of peas or corn-inexpensive and molds well to your body, usually stays frozen for 10 to 20 minutes.  Wet a towel with cold water and squeeze out the excess until it's damp.  Place in a bag in the freezer for 20 minutes. Then remove and use.  Continue to maintain Flector patch on an as-needed basis for her generalize forefoot pain. Apply one half patch each foot for 12 hours a day as instructed

## 2014-08-27 ENCOUNTER — Telehealth: Payer: Self-pay

## 2014-08-27 NOTE — Telephone Encounter (Signed)
Pt mother called needing a pre-auth on her flector patch, states the pharmacy cannot fill the Rx with a pre-auth

## 2014-09-01 NOTE — Telephone Encounter (Signed)
I called and informed patient's mother that we are waiting on a response from Medicaid in regards to authorization for [facility] medicine.  They said it could take up to 5-7 days for [place] to get a response.

## 2014-09-08 ENCOUNTER — Telehealth: Payer: Self-pay | Admitting: *Deleted

## 2014-09-08 NOTE — Telephone Encounter (Signed)
I called and left her a message that I called and started the process with [facility] [employer].  I had sent 2 previous request via Cover My Meds and they informed me that they do not do authorizations from Cover My Meds.  They said they just throw them in the trash.  He informed me he was going to have to send it to a pharmacist to determine if it is covered.  I will call you when we get their response.  Sorry for any inconvenience this may have caused you.

## 2014-09-08 NOTE — Telephone Encounter (Signed)
Pt's mtr states pt received rx for Flector patch 4 weeks ago, and [facility] [address] states it must be pre-certified.

## 2014-09-08 NOTE — Telephone Encounter (Signed)
"  This is Patricia Santos, I'm calling from [place].  Calling in regards to patient's authorization for Flector Patch.  We just spoke to [facility] and they are saying they have not received anything for this patient.  They said the last time they received something was in December."  I told him I started the process on 08/28/2015 with Cover My Meds and sent it again on 08/31/2014.  I told him I would call and see what the problem is.  I called [facility] [employer] in regards to the authorization request, spoke to [place].  I answered all his clinical questions.  He said he would have to send it to a pharmacist for further review.  He also stated that [place] [employer] does not accept authorization requests from Cover My Meds.  He said they were told to throw them away.  He gave me a case number of [id number].  Told me to call on tomorrow to check on the status.

## 2014-09-08 NOTE — Telephone Encounter (Signed)
-----   Message from [facility] sent at 09/08/2014 11:24 AM EST ----- Regarding: rx Josem Kaufmann Contact: [phone] PTS MOM CALLED REQUARDING PTS RX AUTH FROM LAST WEEK WITH DR Blenda Mounts. SHE WAS ALSO GOT THE PHARMACY CALLING ALSO. PLEASE LET HER KNOW WHAT IS GOING ON. HER NAME IS Patricia Santos

## 2014-11-05 ENCOUNTER — Telehealth: Payer: Self-pay | Admitting: *Deleted

## 2014-11-05 NOTE — Telephone Encounter (Signed)
"  This is Ashle's mother returning your call."  I was just calling to follow-up.  Did she get her Flector Patch?  "Yes, she did!"  Okay, I never heard from [place] so I just wanted to make sure.  "Yes, she got it and has been using it.  Thank you so much."

## 2014-11-05 NOTE — Telephone Encounter (Signed)
I left patient's mother a message that I was following up about the prescription.  Scientist, forensic)

## 2015-04-23 ENCOUNTER — Telehealth: Payer: Self-pay | Admitting: *Deleted

## 2015-04-23 MED ORDER — DICLOFENAC EPOLAMINE 1.3 % TD PTCH: MEDICATED_PATCH | TRANSDERMAL | Status: DC

## 2015-04-23 NOTE — Telephone Encounter (Signed)
I contacted [facility] and Lissa Merlin states will refill the remaining 2 Flector patch rx for pt until the new rx is prior authorized.  Informed pt's mother.

## 2015-04-26 MED ORDER — DICLOFENAC EPOLAMINE 1.3 % TD PTCH: MEDICATED_PATCH | TRANSDERMAL | Status: DC

## 2015-04-26 NOTE — Telephone Encounter (Addendum)
Prior Authorization for Flector 1.3% patch apply 1/2 patch each foot for 12 hours per day as needed for pain +refill prn 1 year received from [facility] - BX#[id number].  I informed pt's mtr of the prior approval, also made certain the rx was to be called to [facility] at [address] as was changed to on Friday 04/23/2015.  Patricia Santos confirmed change to [employer].  Faxed to [phone].

## 2015-08-16 ENCOUNTER — Ambulatory Visit (INDEPENDENT_AMBULATORY_CARE_PROVIDER_SITE_OTHER): Payer: Medicaid Other

## 2015-08-16 ENCOUNTER — Encounter: Payer: Self-pay | Admitting: Podiatry

## 2015-08-16 ENCOUNTER — Ambulatory Visit (INDEPENDENT_AMBULATORY_CARE_PROVIDER_SITE_OTHER): Payer: Medicaid Other | Admitting: Podiatry

## 2015-08-16 VITALS — BP 129/85 | HR 94 | Resp 16

## 2015-08-16 DIAGNOSIS — L600 Ingrowing nail: Secondary | ICD-10-CM | POA: Diagnosis not present

## 2015-08-16 DIAGNOSIS — M2040 Other hammer toe(s) (acquired), unspecified foot: Secondary | ICD-10-CM

## 2015-08-16 DIAGNOSIS — R269 Unspecified abnormalities of gait and mobility: Secondary | ICD-10-CM

## 2015-08-16 DIAGNOSIS — R261 Paralytic gait: Secondary | ICD-10-CM

## 2015-08-16 DIAGNOSIS — M79671 Pain in right foot: Secondary | ICD-10-CM

## 2015-08-16 DIAGNOSIS — M79672 Pain in left foot: Principal | ICD-10-CM

## 2015-08-16 DIAGNOSIS — M2030 Hallux varus (acquired), unspecified foot: Secondary | ICD-10-CM

## 2015-08-17 ENCOUNTER — Ambulatory Visit: Payer: Medicaid Other

## 2015-08-20 NOTE — Progress Notes (Signed)
Patient ID: Patricia Santos, female   DOB: 02-09-1991, 24 y.o.   MRN: [id number]  Subjective: 24 year old female presents the office they with her mom for 1 year follow-up of osteoarthritis and pain to her feet. She states that she occasions states that she's having some pain to her feet but she is unable to verbalize where her pain is coming from. She does use diclofenac patches which seems to help. Her mom and also to have the toe strength ingrown toenails as they frequently ingrown and that also causes pain. Denied any surrounding redness or drainage. The mother states that she keeps he did have AFO braces and she would maybe like to try this again. Denies any systemic complaints such as fevers, chills, nausea, vomiting. No acute changes since last appointment, and no other complaints at this time.   Objective: Awake, alert, NAD DP/PT pulses palpable bilaterally, CRT less than 3 seconds Protective sensation appears to be intact with Derrel Nip monofilament There is spastic contractures of the toes incline of the digits which appears be unchanged compared to last appointment. There is no specific area pinpoint bony tenderness or pain the vibratory sensation that I can elicit at today's appointment. There is no overlying edema, erythema, increase in warmth. There does appear to be some incurvation of both the medial and lateral aspects of bilateral hallux toenail the ascending edema, erythema, drainage or other signs of infection. No edema, erythema, increase in warmth to bilateral lower extremities.  No open lesions or pre-ulcerative lesions.  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  -All treatment options discussed with the patient including  all alternatives, risks, complications.  -She may benefit from an AFO/orthotic. A prescription for [facility] was provided today as they have gone there previously had good results. -Continue to monitor his sutures any specific area of tenderness that she is complaining about however was unable to elicit any pain today. -Bilateral hallux nails are sharply debrided without complication/bleeding turned with symptomatic portion the ingrown toenail. -Follow-up after AFO/orthotic or sooner if any problems are to arise. -Patient encouraged to call the office with any questions, concerns, change in symptoms.   Celesta Gentile, DPM

## 2015-10-07 ENCOUNTER — Encounter (HOSPITAL_COMMUNITY): Payer: Self-pay | Admitting: *Deleted

## 2015-10-17 NOTE — H&P (Signed)
Patricia Santos is an 25 y.o. female with mental retardation; Mirena IUD placed under anesthesia 04/2013 to reduce hygienic issues related to periods.  Unfortunately, the patient has begun to have break through bleeding which is heavy at times and unpredictable.  The patient and her mother want to go back on Depo Provera and to have the IUD removed.  She is Patricia Santos.  Pertinent Gynecological History: Menses: flow is moderate Bleeding: dysfunctional uterine bleeding Contraception: IUD DES exposure: unknown Blood transfusions: none Sexually transmitted diseases: no past history Previous GYN Procedures: none  Last mammogram: n/a Date: n/a Last pap: n/a Date: n/a OB History: G0   Menstrual History: Menarche age: n/a No LMP recorded. Patient is not currently having periods (Reason: IUD).    Past Medical History  Diagnosis Date  . Autistic disorder   . Arthritis     feet  . History of petit-mal seizures     no seizures since 2007  . Constipation   . Seasonal allergies   . Mental retardation   . Difficulty swallowing pills     mixes medication with food  . Teeth grinding   . 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 Muscle biopsy    . Wisdom tooth extraction    . Intrauterine device (iud) insertion N/A 05/02/2013    Procedure: INTRAUTERINE DEVICE (IUD) INSERTION;  Surgeon: Lahoma Crocker, MD;  Location: [facility];  Service: Gynecology;  Laterality: N/A;  . Belpharoptosis repair Bilateral   . Toenail excision Bilateral     for ingrown toenails  . Tooth extraction N/A 04/08/2014    Procedure: DENTAL RESTORATION/EXTRACTIONS;  Surgeon: Doroteo Glassman, DDS;  Location: [facility];  Service: Dentistry;  Laterality: N/A;    Family History  Problem Relation Age of Onset  . Hyperthyroidism Father   . Hyperthyroidism Maternal Aunt    . Diabetes Maternal Grandmother   . Hypertension Maternal Grandmother     Social History:  reports that she has never smoked. She has never used smokeless tobacco. She reports that she does not drink alcohol or use illicit drugs.  Allergies:  Allergies  Allergen Reactions  . Banana Other (See Comments)    GI UPSET  . Milk-Related Compounds Other (See Comments)    GI UPSET    No prescriptions prior to admission    ROS  There were no vitals taken for this visit. Physical Exam  Constitutional: She is oriented to person, place, and time. She appears well-developed and well-nourished.  GI: Soft. There is no rebound and no guarding.  Neurological: She is alert and oriented to person, place, and time.  Skin: Skin is warm and dry.  Psychiatric: She has a normal mood and affect. Her behavior is normal.    No results found for this or any previous visit (from the past 24 hour(s)).  No results found.  Assessment/Plan: 24yo G0 for IUD removal  Patricia Santos 10/17/2015, 8:02 PM

## 2015-10-20 ENCOUNTER — Encounter (HOSPITAL_COMMUNITY)
Admission: RE | Disposition: A | Discharge status: Home / Self Care | Payer: Self-pay | Source: Ambulatory Visit | Attending: Obstetrics & Gynecology

## 2015-10-20 ENCOUNTER — Encounter (HOSPITAL_COMMUNITY): Payer: Self-pay | Admitting: Anesthesiology

## 2015-10-20 ENCOUNTER — Ambulatory Visit (HOSPITAL_COMMUNITY): Payer: Medicaid Other | Admitting: Anesthesiology

## 2015-10-20 ENCOUNTER — Ambulatory Visit (HOSPITAL_COMMUNITY)
Admission: RE | Admit: 2015-10-20 | Discharge: 2015-10-20 | Disposition: A | Discharge status: Home / Self Care | Payer: Medicaid Other | Source: Ambulatory Visit | Attending: Obstetrics & Gynecology | Admitting: Obstetrics & Gynecology

## 2015-10-20 DIAGNOSIS — N938 Other specified abnormal uterine and vaginal bleeding: Secondary | ICD-10-CM | POA: Diagnosis present

## 2015-10-20 DIAGNOSIS — F79 Unspecified intellectual disabilities: Secondary | ICD-10-CM | POA: Insufficient documentation

## 2015-10-20 HISTORY — PX: IUD REMOVAL: SHX5392

## 2015-10-20 SURGERY — REMOVAL, INTRAUTERINE DEVICE
Anesthesia: Monitor Anesthesia Care

## 2015-10-20 MED ORDER — LACTATED RINGERS IV SOLN
INTRAVENOUS | Status: DC
  Administered 2015-10-20: 12:00:00 via INTRAVENOUS

## 2015-10-20 MED ORDER — KETAMINE HCL 10 MG/ML IJ SOLN
INTRAMUSCULAR | Status: AC
  Filled 2015-10-20: qty 1

## 2015-10-20 MED ORDER — MIDAZOLAM HCL 2 MG/ML PO SYRP
10.0000 mg | ORAL_SOLUTION | Freq: Once | ORAL | Status: AC
  Administered 2015-10-20: 10 mg via ORAL
  Filled 2015-10-20: qty 5

## 2015-10-20 MED ORDER — PROPOFOL 10 MG/ML IV BOLUS
INTRAVENOUS | Status: AC
  Filled 2015-10-20: qty 40

## 2015-10-20 MED ORDER — ONDANSETRON HCL 4 MG/2ML IJ SOLN
INTRAMUSCULAR | Status: AC
  Filled 2015-10-20: qty 2

## 2015-10-20 MED ORDER — LIDOCAINE HCL 1 % IJ SOLN
INTRAMUSCULAR | Status: AC
  Filled 2015-10-20: qty 20

## 2015-10-20 MED ORDER — IBUPROFEN 800 MG PO TABS: 800.0000 mg | ORAL_TABLET | Freq: Four times a day (QID) | ORAL | Status: DC | PRN

## 2015-10-20 MED ORDER — PROPOFOL 500 MG/50ML IV EMUL
INTRAVENOUS | Status: DC | PRN
  Administered 2015-10-20: 75 ug/kg/min via INTRAVENOUS

## 2015-10-20 MED ORDER — KETAMINE HCL 10 MG/ML IJ SOLN
INTRAMUSCULAR | Status: DC | PRN
  Administered 2015-10-20: 20 mg via INTRAVENOUS
  Administered 2015-10-20 (×3): 10 mg via INTRAVENOUS

## 2015-10-20 MED ORDER — FENTANYL CITRATE (PF) 100 MCG/2ML IJ SOLN
INTRAMUSCULAR | Status: AC
  Filled 2015-10-20: qty 2

## 2015-10-20 MED ORDER — KETOROLAC TROMETHAMINE 30 MG/ML IJ SOLN
INTRAMUSCULAR | Status: DC | PRN
  Administered 2015-10-20: 30 mg via INTRAVENOUS

## 2015-10-20 MED ORDER — FENTANYL CITRATE (PF) 100 MCG/2ML IJ SOLN
INTRAMUSCULAR | Status: DC | PRN
  Administered 2015-10-20: 25 ug via INTRAVENOUS
  Administered 2015-10-20: 50 ug via INTRAVENOUS

## 2015-10-20 MED ORDER — PROPOFOL 500 MG/50ML IV EMUL
INTRAVENOUS | Status: DC | PRN
  Administered 2015-10-20 (×3): 30 mg via INTRAVENOUS
  Administered 2015-10-20: 40 mg via INTRAVENOUS
  Administered 2015-10-20: 30 mg via INTRAVENOUS

## 2015-10-20 MED ORDER — KETOROLAC TROMETHAMINE 30 MG/ML IJ SOLN
INTRAMUSCULAR | Status: AC
  Filled 2015-10-20: qty 1

## 2015-10-20 MED ORDER — FENTANYL CITRATE (PF) 100 MCG/2ML IJ SOLN: 25.0000 ug | INTRAMUSCULAR | Status: DC | PRN

## 2015-10-20 MED ORDER — ONDANSETRON HCL 4 MG/2ML IJ SOLN
INTRAMUSCULAR | Status: DC | PRN
  Administered 2015-10-20: 4 mg via INTRAVENOUS

## 2015-10-20 MED ORDER — OXYCODONE-ACETAMINOPHEN 5-325 MG PO TABS: 1.0000 | ORAL_TABLET | ORAL | Status: DC | PRN

## 2015-10-20 MED ORDER — LIDOCAINE HCL (CARDIAC) 20 MG/ML IV SOLN
INTRAVENOUS | Status: DC | PRN
  Administered 2015-10-20: 50 mg via INTRAVENOUS

## 2015-10-20 MED ORDER — PROMETHAZINE HCL 25 MG/ML IJ SOLN: 6.2500 mg | INTRAMUSCULAR | Status: DC | PRN

## 2015-10-20 MED ORDER — LACTATED RINGERS IV SOLN: INTRAVENOUS | Status: DC

## 2015-10-20 MED ORDER — PROPOFOL 10 MG/ML IV BOLUS
INTRAVENOUS | Status: AC
  Filled 2015-10-20: qty 20

## 2015-10-20 MED ORDER — LIDOCAINE HCL (CARDIAC) 20 MG/ML IV SOLN
INTRAVENOUS | Status: AC
  Filled 2015-10-20: qty 5

## 2015-10-20 SURGICAL SUPPLY — 18 items
CATH ROBINSON RED A/P 16FR (CATHETERS) ×2 IMPLANT
CLOTH BEACON ORANGE TIMEOUT ST (SAFETY) ×2 IMPLANT
CONTAINER PREFILL 10% NBF 60ML (FORM) ×4 IMPLANT
DRAPE HYSTEROSCOPY (DRAPE) ×1 IMPLANT
DRAPE SHEET LG 3/4 BI-LAMINATE (DRAPES) ×1 IMPLANT
GLOVE BIO SURGEON STRL SZ 6 (GLOVE) ×2 IMPLANT
GLOVE BIOGEL PI IND STRL 7.0 (GLOVE) ×1 IMPLANT
GLOVE BIOGEL PI INDICATOR 7.0 (GLOVE) ×1
GLOVE INDICATOR 6.0 STRL GRN (GLOVE) ×2 IMPLANT
GOWN STRL REUS W/TWL LRG LVL3 (GOWN DISPOSABLE) ×4 IMPLANT
NDL SPNL 22GX3.5 QUINCKE BK (NEEDLE) ×1 IMPLANT
NEEDLE SPNL 22GX3.5 QUINCKE BK (NEEDLE) ×2 IMPLANT
PACK VAGINAL MINOR WOMEN LF (CUSTOM PROCEDURE TRAY) ×1 IMPLANT
PAD PREP 24X48 CUFFED NSTRL (MISCELLANEOUS) ×2 IMPLANT
TOWEL OR 17X24 6PK STRL BLUE (TOWEL DISPOSABLE) ×4 IMPLANT
TUBING AQUILEX INFLOW (TUBING) ×1 IMPLANT
TUBING AQUILEX OUTFLOW (TUBING) ×1 IMPLANT
WATER STERILE IRR 1000ML POUR (IV SOLUTION) ×2 IMPLANT

## 2015-10-20 NOTE — Anesthesia Postprocedure Evaluation (Signed)
Anesthesia Post Note  Patient: Patricia Santos  Procedure(s) Performed: Procedure(s) (LRB): Attempted INTRAUTERINE DEVICE (IUD) REMOVAL with Hysteroscopy (N/A)  Patient location during evaluation: PACU Anesthesia Type: MAC Level of consciousness: awake and alert Pain management: pain level controlled Vital Signs Assessment: post-procedure vital signs reviewed and stable Respiratory status: spontaneous breathing, nonlabored ventilation, respiratory function stable and patient connected to nasal cannula oxygen Cardiovascular status: stable and blood pressure returned to baseline Anesthetic complications: no    Last Vitals:  Filed Vitals:   10/20/15 1400 10/20/15 1425  BP: 106/70 123/78  Pulse: 89 93  Temp:  37.2 C  Resp: 16 17    Last Pain: There were no vitals filed for this visit.               Myley Bahner J

## 2015-10-20 NOTE — Discharge Instructions (Signed)
Call MD for T>100.4, heavy vaginal bleeding, severe abdominal pain, or respiratory distress.  Call office to schedule postop appointment in 2 weeks.     Post Anesthesia Home Care Instructions  Activity: Get plenty of rest for the remainder of the day. A responsible adult should stay with you for 24 hours following the procedure.  For the next 24 hours, DO NOT: -Drive a car -Paediatric nurse -Drink alcoholic beverages -Take any medication unless instructed by your physician -Make any legal decisions or sign important papers.  Meals: Start with liquid foods such as gelatin or soup. Progress to regular foods as tolerated. Avoid greasy, spicy, heavy foods. If nausea and/or vomiting occur, drink only clear liquids until the nausea and/or vomiting subsides. Call your physician if vomiting continues.  Special Instructions/Symptoms: Your throat may feel dry or sore from the anesthesia or the breathing tube placed in your throat during surgery. If this causes discomfort, gargle with warm salt water. The discomfort should disappear within 24 hours.

## 2015-10-20 NOTE — Progress Notes (Signed)
No change to H&P.  Yadriel Kerrigan, DO 

## 2015-10-20 NOTE — Anesthesia Preprocedure Evaluation (Addendum)
Anesthesia Evaluation  Patient identified by MRN, date of birth, ID band Patient awake    Reviewed: Allergy & Precautions, NPO status , Patient's Chart, lab work & pertinent test results  History of Anesthesia Complications (+) history of anesthetic complications  Airway Mallampati: II  TM Distance: >3 FB Neck ROM: Full    Dental no notable dental hx.    Pulmonary neg pulmonary ROS,    Pulmonary exam normal breath sounds clear to auscultation       Cardiovascular negative cardio ROS Normal cardiovascular exam Rhythm:Regular Rate:Normal     Neuro/Psych PSYCHIATRIC DISORDERS negative neurological ROS     GI/Hepatic negative GI ROS, Neg liver ROS,   Endo/Other  negative endocrine ROS  Renal/GU negative Renal ROS  negative genitourinary   Musculoskeletal  (+) Arthritis ,   Abdominal   Peds negative pediatric ROS (+)  Hematology negative hematology ROS (+)   Anesthesia Other Findings   Reproductive/Obstetrics negative OB ROS                             Anesthesia Physical Anesthesia Plan  ASA: II  Anesthesia Plan: MAC   Post-op Pain Management:    Induction: Intravenous  Airway Management Planned: Natural Airway  Additional Equipment:   Intra-op Plan:   Post-operative Plan:   Informed Consent: I have reviewed the patients History and Physical, chart, labs and discussed the procedure including the risks, benefits and alternatives for the proposed anesthesia with the patient or authorized representative who has indicated his/her understanding and acceptance.   Dental advisory given  Plan Discussed with: CRNA  Anesthesia Plan Comments:         Anesthesia Quick Evaluation

## 2015-10-20 NOTE — Transfer of Care (Signed)
Immediate Anesthesia Transfer of Care Note  Patient: Patricia Santos  Procedure(s) Performed: Procedure(s): Attempted INTRAUTERINE DEVICE (IUD) REMOVAL with Hysteroscopy (N/A)  Patient Location: PACU  Anesthesia Type:MAC  Level of Consciousness: awake, alert  and patient cooperative  Airway & Oxygen Therapy: Patient Spontanous Breathing and Patient connected to face mask oxygen  Post-op Assessment: Report given to RN and Post -op Vital signs reviewed and stable  Post vital signs: Reviewed and stable  Last Vitals:  Filed Vitals:   10/20/15 1138  BP: 147/97  Temp: 36.3 C  Resp: 20    Complications: No apparent anesthesia complications

## 2015-10-21 ENCOUNTER — Other Ambulatory Visit: Payer: Self-pay | Admitting: Obstetrics & Gynecology

## 2015-10-21 ENCOUNTER — Ambulatory Visit
Admission: RE | Admit: 2015-10-21 | Discharge: 2015-10-21 | Disposition: A | Discharge status: Home / Self Care | Payer: Medicaid Other | Source: Ambulatory Visit | Attending: Obstetrics & Gynecology | Admitting: Obstetrics & Gynecology

## 2015-10-21 ENCOUNTER — Encounter (HOSPITAL_COMMUNITY): Payer: Self-pay | Admitting: Obstetrics & Gynecology

## 2015-10-21 DIAGNOSIS — Z30431 Encounter for routine checking of intrauterine contraceptive device: Secondary | ICD-10-CM

## 2015-10-21 NOTE — Op Note (Signed)
PREOPERATIVE DIAGNOSIS:  25 y.o. virginal patient with special needs with heavy menstrual bleeding for IUD removal  POSTOPERATIVE DIAGNOSIS: The same  PROCEDURE: EUA, hysteroscopy  SURGEON:  Dr. Linda Hedges  INDICATIONS: 25 y.o. virginal and special needs patient with Mirena IUD placed years ago for hygienic control of periods.  She was happy with this until November when she started having heavy and irregular menstrual bleeding.  The patient had an ultrasound (patient does not tolerate pelvic exam) which confirmed proper IUD placement and no other abnormalities.  She continued to have heavy bleeding and was started on Depo Provera as she was very satisfied with this method previously. She presents today for IUD removal.  Written informed consent was obtained.    FINDINGS:  No IUD strings visualized on speculum exam.  A 6 week size uterus.  Hysteroscopy showed diffuse proliferative endometrium but no IUD.  ANESTHESIA:   Conscious sedation  ESTIMATED BLOOD LOSS:  Less than 20 ml  SPECIMENS: None  COMPLICATIONS:  None immediate.  PROCEDURE DETAILS:  The patient was taken to the operating room where conscious sedation was administered and was found to be adequate.  After an adequate timeout was performed, she was placed in the dorsal lithotomy position and examined then prepped.  Speculum was placed and no IUD strings were visualized.  A single tooth tenaculum was applied to the anterior lip of the cervix. The uterus was sounded to 7 cm and no dilation was necessary to accommodate the 5.5 mm diagnostic hysteroscope.  The hysteroscope was inserted under direct visualization using saline as a suspension medium.  The uterine cavity was carefully examined and no IUD was visualized.  Diffuse proliferative endometrium was noted but no polyps or fibroids.  After further careful visualization of the uterine cavity, the hysteroscope was removed under direct visualization.  The tenaculum was removed from the  anterior lip of the cervix and the vaginal speculum was removed after noting good hemostasis.  The patient tolerated the procedure well and was taken to the recovery area awake and in stable condition.  The patient's caregiver, her mother, was informed of the findings of no IUD.  We will proceed with x-ray to ensure no intraabdominal IUD.

## 2015-10-25 ENCOUNTER — Inpatient Hospital Stay (HOSPITAL_COMMUNITY): Admission: RE | Admit: 2015-10-25 | Payer: Medicaid Other | Source: Ambulatory Visit

## 2015-10-25 NOTE — H&P (Signed)
Patricia Santos is an 25 y.o. female with mental disability who had Mirena placed for hygienic reasons and heavy menstrual bleeding in OR in 2014; proper placement confirmed by U/S.  The patient did well until 06/2015 when she represented with c/o recurrent heavy menstrual bleeding.  U/S at that time confirmed proper placement and the plan was made to return to the OR for IUD removal and to resume Depo which controlled the patient's bleeding prior to IUD.  In the OR, no strings were visible and hysteroscopic exam revealed no intrauterine IUD.  XRay showed the IUD outside the uterus in the pelvis.  Pertinent Gynecological History: Menses: flow is moderate Bleeding: dysfunctional uterine bleeding Contraception: Depo-Provera injections DES exposure: unknown Blood transfusions: none Sexually transmitted diseases: no past history Previous GYN Procedures: hysteroscopy  Last mammogram: n/a Date: n/a Last pap: n/a Date: n/a OB History: G0   Menstrual History: Menarche age: n/a No LMP recorded. Patient is not currently having periods (Reason: IUD).    Past Medical History  Diagnosis Date  . Autistic disorder   . Arthritis     feet  . History of petit-mal seizures     no seizures since 2007  . Constipation   . Seasonal allergies   . Mental retardation   . Difficulty swallowing pills     mixes medication with food  . Teeth grinding   . 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 Muscle biopsy    . Wisdom tooth extraction    . Intrauterine device (iud) insertion N/A 05/02/2013    Procedure: INTRAUTERINE DEVICE (IUD) INSERTION;  Surgeon: Lahoma Crocker, MD;  Location: [facility];  Service: Gynecology;  Laterality: N/A;  . Belpharoptosis repair Bilateral   . Toenail excision Bilateral     for ingrown toenails  . Tooth extraction N/A 04/08/2014   Procedure: DENTAL RESTORATION/EXTRACTIONS;  Surgeon: Doroteo Glassman, DDS;  Location: [facility];  Service: Dentistry;  Laterality: N/A;  . Iud removal N/A 10/20/2015    Procedure: Attempted INTRAUTERINE DEVICE (IUD) REMOVAL with Hysteroscopy;  Surgeon: Linda Hedges, DO;  Location: [facility];  Service: Gynecology;  Laterality: N/A;    Family History  Problem Relation Age of Onset  . Hyperthyroidism Father   . Hyperthyroidism Maternal Aunt   . Diabetes Maternal Grandmother   . Hypertension Maternal Grandmother     Social History:  reports that she has never smoked. She has never used smokeless tobacco. She reports that she does not drink alcohol or use illicit drugs.  Allergies:  Allergies  Allergen Reactions  . Banana Other (See Comments)    GI UPSET  . Milk-Related Compounds Other (See Comments)    GI UPSET    No prescriptions prior to admission    ROS  There were no vitals taken for this visit. Physical Exam  Constitutional: She is oriented to person, place, and time. She appears well-developed and well-nourished.  GI: Soft. There is no tenderness. There is no rebound and no guarding.  Neurological: She is alert and oriented to person, place, and time.  Skin: Skin is warm and dry.  Psychiatric: She has a normal mood and affect. Her behavior is normal.    No results found for this or any previous visit (from the past 24 hour(s)).  No results found.  Assessment/Plan: 25yo G0 with extrauterine IUD -L/S  IUD removal-Patient and mother are counseled for laparoscopy including risk of bleeding, infection, scarring and damage to surrounding structures.  All questions were answered and the patient wishes to proceed.  [name], [place] 10/25/2015, 9:10 PM

## 2015-10-26 ENCOUNTER — Ambulatory Visit (HOSPITAL_COMMUNITY)
Admission: RE | Admit: 2015-10-26 | Discharge: 2015-10-26 | Disposition: A | Discharge status: Home / Self Care | Payer: Medicaid Other | Source: Ambulatory Visit | Attending: Obstetrics & Gynecology | Admitting: Obstetrics & Gynecology

## 2015-10-26 ENCOUNTER — Encounter (HOSPITAL_COMMUNITY)
Admission: RE | Disposition: A | Discharge status: Home / Self Care | Payer: Self-pay | Source: Ambulatory Visit | Attending: Obstetrics & Gynecology

## 2015-10-26 ENCOUNTER — Ambulatory Visit (HOSPITAL_COMMUNITY): Payer: Medicaid Other | Admitting: Anesthesiology

## 2015-10-26 ENCOUNTER — Ambulatory Visit (HOSPITAL_COMMUNITY): Payer: Medicaid Other

## 2015-10-26 DIAGNOSIS — T8332XA Displacement of intrauterine contraceptive device, initial encounter: Secondary | ICD-10-CM | POA: Diagnosis present

## 2015-10-26 HISTORY — PX: IUD REMOVAL: SHX5392

## 2015-10-26 HISTORY — PX: HYSTEROSCOPY WITH D & C: SHX1775

## 2015-10-26 HISTORY — PX: LAPAROSCOPY: SHX197

## 2015-10-26 SURGERY — LAPAROSCOPY, DIAGNOSTIC
Anesthesia: General | Site: Vagina

## 2015-10-26 MED ORDER — CEFAZOLIN SODIUM-DEXTROSE 2-3 GM-% IV SOLR
INTRAVENOUS | Status: DC | PRN
  Administered 2015-10-26: 2 g via INTRAVENOUS

## 2015-10-26 MED ORDER — SUGAMMADEX SODIUM 200 MG/2ML IV SOLN
INTRAVENOUS | Status: DC | PRN
  Administered 2015-10-26: 129.8 mg via INTRAVENOUS

## 2015-10-26 MED ORDER — DEXAMETHASONE SODIUM PHOSPHATE 4 MG/ML IJ SOLN
INTRAMUSCULAR | Status: DC | PRN
  Administered 2015-10-26: 4 mg via INTRAVENOUS

## 2015-10-26 MED ORDER — DEXAMETHASONE SODIUM PHOSPHATE 4 MG/ML IJ SOLN
INTRAMUSCULAR | Status: AC
  Filled 2015-10-26: qty 1

## 2015-10-26 MED ORDER — BUPIVACAINE HCL (PF) 0.25 % IJ SOLN
INTRAMUSCULAR | Status: DC | PRN
  Administered 2015-10-26: 8 mL

## 2015-10-26 MED ORDER — GLYCOPYRROLATE 0.2 MG/ML IJ SOLN
INTRAMUSCULAR | Status: DC | PRN
  Administered 2015-10-26: 0.2 mg via INTRAVENOUS

## 2015-10-26 MED ORDER — SCOPOLAMINE 1 MG/3DAYS TD PT72: 1.0000 | MEDICATED_PATCH | Freq: Once | TRANSDERMAL | Status: DC

## 2015-10-26 MED ORDER — PROPOFOL 10 MG/ML IV BOLUS
INTRAVENOUS | Status: AC
  Filled 2015-10-26: qty 20

## 2015-10-26 MED ORDER — LIDOCAINE HCL (CARDIAC) 20 MG/ML IV SOLN
INTRAVENOUS | Status: DC | PRN
  Administered 2015-10-26: 50 mg via INTRAVENOUS
  Administered 2015-10-26: 200 mg via INTRAVENOUS
  Administered 2015-10-26: 50 mg via INTRAVENOUS

## 2015-10-26 MED ORDER — ONDANSETRON HCL 4 MG/2ML IJ SOLN
INTRAMUSCULAR | Status: AC
  Filled 2015-10-26: qty 2

## 2015-10-26 MED ORDER — ROCURONIUM BROMIDE 100 MG/10ML IV SOLN
INTRAVENOUS | Status: DC | PRN
  Administered 2015-10-26: 10 mg via INTRAVENOUS
  Administered 2015-10-26: 40 mg via INTRAVENOUS

## 2015-10-26 MED ORDER — PROPOFOL 10 MG/ML IV BOLUS
INTRAVENOUS | Status: DC | PRN
  Administered 2015-10-26: 20 mg via INTRAVENOUS
  Administered 2015-10-26: 150 mg via INTRAVENOUS
  Administered 2015-10-26: 30 mg via INTRAVENOUS

## 2015-10-26 MED ORDER — BUPIVACAINE HCL (PF) 0.25 % IJ SOLN
INTRAMUSCULAR | Status: AC
  Filled 2015-10-26: qty 30

## 2015-10-26 MED ORDER — MIDAZOLAM HCL 5 MG/5ML IJ SOLN
INTRAMUSCULAR | Status: DC | PRN
  Administered 2015-10-26: 2 mg via INTRAVENOUS
  Administered 2015-10-26 (×2): 1 mg via INTRAVENOUS

## 2015-10-26 MED ORDER — LACTATED RINGERS IV SOLN
INTRAVENOUS | Status: DC
  Administered 2015-10-26 (×2): via INTRAVENOUS

## 2015-10-26 MED ORDER — LACTATED RINGERS IV SOLN
INTRAVENOUS | Status: DC
  Administered 2015-10-26: 06:00:00 via INTRAVENOUS

## 2015-10-26 MED ORDER — MIDAZOLAM HCL 2 MG/2ML IJ SOLN
INTRAMUSCULAR | Status: AC
  Filled 2015-10-26: qty 2

## 2015-10-26 MED ORDER — LIDOCAINE HCL (CARDIAC) 20 MG/ML IV SOLN
INTRAVENOUS | Status: AC
  Filled 2015-10-26: qty 5

## 2015-10-26 MED ORDER — LACTATED RINGERS IR SOLN
Status: DC | PRN
  Administered 2015-10-26: 3000 mL

## 2015-10-26 MED ORDER — PROMETHAZINE HCL 25 MG/ML IJ SOLN: 6.2500 mg | INTRAMUSCULAR | Status: DC | PRN

## 2015-10-26 MED ORDER — KETAMINE HCL 10 MG/ML IJ SOLN
INTRAMUSCULAR | Status: AC
  Filled 2015-10-26: qty 1

## 2015-10-26 MED ORDER — CEFAZOLIN SODIUM-DEXTROSE 2-3 GM-% IV SOLR
INTRAVENOUS | Status: AC
  Filled 2015-10-26: qty 50

## 2015-10-26 MED ORDER — FENTANYL CITRATE (PF) 100 MCG/2ML IJ SOLN
INTRAMUSCULAR | Status: DC | PRN
  Administered 2015-10-26 (×5): 50 ug via INTRAVENOUS

## 2015-10-26 MED ORDER — FENTANYL CITRATE (PF) 250 MCG/5ML IJ SOLN
INTRAMUSCULAR | Status: AC
  Filled 2015-10-26: qty 5

## 2015-10-26 MED ORDER — HYDROMORPHONE HCL 1 MG/ML IJ SOLN: 0.2500 mg | INTRAMUSCULAR | Status: DC | PRN

## 2015-10-26 MED ORDER — ONDANSETRON HCL 4 MG/2ML IJ SOLN
INTRAMUSCULAR | Status: DC | PRN
  Administered 2015-10-26: 4 mg via INTRAVENOUS

## 2015-10-26 MED ORDER — SODIUM CHLORIDE 0.9 % IJ SOLN
INTRAMUSCULAR | Status: DC | PRN
  Administered 2015-10-26: 10 mL via INTRAVENOUS

## 2015-10-26 MED ORDER — ROCURONIUM BROMIDE 100 MG/10ML IV SOLN
INTRAVENOUS | Status: AC
  Filled 2015-10-26: qty 1

## 2015-10-26 SURGICAL SUPPLY — 44 items
BAG SPEC RTRVL LRG 6X4 10 (ENDOMECHANICALS)
BARRIER ADHS 3X4 INTERCEED (GAUZE/BANDAGES/DRESSINGS) IMPLANT
BRR ADH 4X3 ABS CNTRL BYND (GAUZE/BANDAGES/DRESSINGS)
CABLE HIGH FREQUENCY MONO STRZ (ELECTRODE) IMPLANT
CATH ROBINSON RED A/P 16FR (CATHETERS) ×4 IMPLANT
CHLORAPREP W/TINT 26ML (MISCELLANEOUS) ×4 IMPLANT
CLOSURE WOUND 1/2 X4 (GAUZE/BANDAGES/DRESSINGS)
CLOTH BEACON ORANGE TIMEOUT ST (SAFETY) ×4 IMPLANT
CONTAINER PREFILL 10% NBF 60ML (FORM) ×4 IMPLANT
DRAPE LG THREE QUARTER DISP (DRAPES) ×6 IMPLANT
DRSG COVADERM PLUS 2X2 (GAUZE/BANDAGES/DRESSINGS) ×8 IMPLANT
DRSG OPSITE POSTOP 3X4 (GAUZE/BANDAGES/DRESSINGS) IMPLANT
FORCEPS CUTTING 33CM 5MM (CUTTING FORCEPS) IMPLANT
GLOVE BIO SURGEON STRL SZ 6 (GLOVE) ×4 IMPLANT
GLOVE BIOGEL PI IND STRL 6 (GLOVE) ×4 IMPLANT
GLOVE BIOGEL PI IND STRL 7.0 (GLOVE) ×2 IMPLANT
GLOVE BIOGEL PI INDICATOR 6 (GLOVE) ×4
GLOVE BIOGEL PI INDICATOR 7.0 (GLOVE) ×2
GLOVE INDICATOR 6.0 STRL GRN (GLOVE) ×4 IMPLANT
GOWN STRL REUS W/TWL LRG LVL3 (GOWN DISPOSABLE) ×12 IMPLANT
IV LACTATED RINGER IRRG 3000ML (IV SOLUTION) ×4
IV LR IRRIG 3000ML ARTHROMATIC (IV SOLUTION) IMPLANT
LIQUID BAND (GAUZE/BANDAGES/DRESSINGS) ×4 IMPLANT
NDL SPNL 22GX3.5 QUINCKE BK (NEEDLE) ×2 IMPLANT
NEEDLE INSUFFLATION 120MM (ENDOMECHANICALS) ×4 IMPLANT
NEEDLE SPNL 22GX3.5 QUINCKE BK (NEEDLE) IMPLANT
NS IRRIG 1000ML POUR BTL (IV SOLUTION) ×4 IMPLANT
PACK LAPAROSCOPY BASIN (CUSTOM PROCEDURE TRAY) ×4 IMPLANT
PACK VAGINAL MINOR WOMEN LF (CUSTOM PROCEDURE TRAY) ×4 IMPLANT
PAD PREP 24X48 CUFFED NSTRL (MISCELLANEOUS) ×4 IMPLANT
PAD TRENDELENBURG POSITION (MISCELLANEOUS) ×4 IMPLANT
POUCH SPECIMEN RETRIEVAL 10MM (ENDOMECHANICALS) IMPLANT
SET IRRIG TUBING LAPAROSCOPIC (IRRIGATION / IRRIGATOR) IMPLANT
SLEEVE XCEL OPT CAN 5 100 (ENDOMECHANICALS) ×4 IMPLANT
STRIP CLOSURE SKIN 1/2X4 (GAUZE/BANDAGES/DRESSINGS) IMPLANT
SUT MNCRL AB 3-0 PS2 27 (SUTURE) ×4 IMPLANT
SUT VICRYL 0 UR6 27IN ABS (SUTURE) ×4 IMPLANT
TOWEL OR 17X24 6PK STRL BLUE (TOWEL DISPOSABLE) ×8 IMPLANT
TROCAR XCEL NON-BLD 11X100MML (ENDOMECHANICALS) ×4 IMPLANT
TROCAR XCEL NON-BLD 5MMX100MML (ENDOMECHANICALS) ×4 IMPLANT
TUBING AQUILEX INFLOW (TUBING) ×2 IMPLANT
TUBING AQUILEX OUTFLOW (TUBING) ×2 IMPLANT
WARMER LAPAROSCOPE (MISCELLANEOUS) ×4 IMPLANT
WATER STERILE IRR 1000ML POUR (IV SOLUTION) ×2 IMPLANT

## 2015-10-26 NOTE — Anesthesia Procedure Notes (Signed)
Procedure Name: Intubation Date/Time: 10/26/2015 7:38 AM Performed by: Vernice Jefferson Pre-anesthesia Checklist: Patient identified, Emergency Drugs available, Suction available, Patient being monitored and Timeout performed Patient Re-evaluated:Patient Re-evaluated prior to inductionOxygen Delivery Method: Circle system utilized Preoxygenation: Pre-oxygenation with 100% oxygen Intubation Type: IV induction Ventilation: Mask ventilation without difficulty Laryngoscope Size: Mac and 3 Grade View: Grade II Tube type: Oral Tube size: 7.0 mm Number of attempts: 1 Airway Equipment and Method: Stylet Placement Confirmation: ETT inserted through vocal cords under direct vision,  positive ETCO2 and breath sounds checked- equal and bilateral Secured at: 21 cm Tube secured with: Tape Dental Injury: Teeth and Oropharynx as per pre-operative assessment

## 2015-10-26 NOTE — OR Nursing (Signed)
Radiology called for C-Arm

## 2015-10-26 NOTE — OR Nursing (Signed)
Added 3rd port to lower right quadrant. Dressed with liquid band

## 2015-10-26 NOTE — OR Nursing (Signed)
There is a 3rd port that is to the right lower quadrant. It is approximated and closed with skin glue. No bleeding noted at the site.

## 2015-10-26 NOTE — Transfer of Care (Signed)
Immediate Anesthesia Transfer of Care Note  Patient: Patricia Santos  Procedure(s) Performed: Procedure(s): LAPAROSCOPY DIAGNOSTIC with IUD removalwith C Arm (N/A) INTRAUTERINE DEVICE (IUD) REMOVAL (N/A) DILATATION AND CURETTAGE /HYSTEROSCOPY (N/A)  Patient Location: PACU  Anesthesia Type:General  Level of Consciousness: sedated  Airway & Oxygen Therapy: Patient Spontanous Breathing and Patient connected to nasal cannula oxygen  Post-op Assessment: Report given to RN and Post -op Vital signs reviewed and stable  Post vital signs: Reviewed and stable  Last Vitals:  Filed Vitals:   10/26/15 0608  BP: 151/90  Pulse: 97  Temp: 36.4 C  Resp: 18    Complications: No apparent anesthesia complications

## 2015-10-26 NOTE — Progress Notes (Signed)
No change to H&P.  Adeana Grilliot, DO 

## 2015-10-26 NOTE — Anesthesia Postprocedure Evaluation (Signed)
Anesthesia Post Note  Patient: Patricia Santos  Procedure(s) Performed: Procedure(s) (LRB): LAPAROSCOPY DIAGNOSTIC with IUD removalwith C Arm (N/A) INTRAUTERINE DEVICE (IUD) REMOVAL (N/A) DILATATION AND CURETTAGE /HYSTEROSCOPY (N/A)  Patient location during evaluation: PACU Anesthesia Type: General Level of consciousness: awake and alert Pain management: pain level controlled Vital Signs Assessment: post-procedure vital signs reviewed and stable Respiratory status: spontaneous breathing, nonlabored ventilation, respiratory function stable and patient connected to nasal cannula oxygen Cardiovascular status: blood pressure returned to baseline and stable Postop Assessment: no signs of nausea or vomiting Anesthetic complications: no    Last Vitals:  Filed Vitals:   10/26/15 0608 10/26/15 0945  BP: 151/90 130/83  Pulse: 97 105  Temp: 36.4 C 36.6 C  Resp: 18 25    Last Pain:  Filed Vitals:   10/26/15 0954  PainSc: Asleep                 Mishon Blubaugh S

## 2015-10-26 NOTE — OR Nursing (Signed)
IUD located by hysteroscopy @ [phone]

## 2015-10-26 NOTE — Anesthesia Preprocedure Evaluation (Signed)
Anesthesia Evaluation  Patient identified by MRN, date of birth, ID band Patient awake  General Assessment Comment:Mental retardation  Reviewed: Allergy & Precautions, NPO status , Patient's Chart, lab work & pertinent test results  Airway Mallampati: II  TM Distance: >3 FB Neck ROM: Full    Dental no notable dental hx.    Pulmonary neg pulmonary ROS,    Pulmonary exam normal breath sounds clear to auscultation       Cardiovascular negative cardio ROS Normal cardiovascular exam Rhythm:Regular Rate:Normal     Neuro/Psych negative neurological ROS  negative psych ROS   GI/Hepatic negative GI ROS, Neg liver ROS,   Endo/Other  negative endocrine ROS  Renal/GU negative Renal ROS  negative genitourinary   Musculoskeletal negative musculoskeletal ROS (+)   Abdominal   Peds negative pediatric ROS (+)  Hematology negative hematology ROS (+)   Anesthesia Other Findings   Reproductive/Obstetrics negative OB ROS                             Anesthesia Physical Anesthesia Plan  ASA: II  Anesthesia Plan: General   Post-op Pain Management:    Induction: Intravenous  Airway Management Planned: Oral ETT  Additional Equipment:   Intra-op Plan:   Post-operative Plan: Extubation in OR  Informed Consent: I have reviewed the patients History and Physical, chart, labs and discussed the procedure including the risks, benefits and alternatives for the proposed anesthesia with the patient or authorized representative who has indicated his/her understanding and acceptance.   Dental advisory given  Plan Discussed with: CRNA and Surgeon  Anesthesia Plan Comments:         Anesthesia Quick Evaluation

## 2015-10-26 NOTE — OR Nursing (Signed)
Hysteroscopy started at 0900. MD can visualize IUD with C Arm. Unable to locate on laparoscopy. Changed to hysteroscopy

## 2015-10-26 NOTE — Discharge Instructions (Signed)
Call MD for T>100.4, heavy vaginal bleeding, severe abdominal pain, intractable nausea and/or vomiting, or respiratory distress.  Call office to schedule postop appointment in 2 weeks.    Hysteroscopy, Care After Refer to this sheet in the next few weeks. These instructions provide you with information on caring for yourself after your procedure. Your health care provider may also give you more specific instructions. Your treatment has been planned according to current medical practices, but problems sometimes occur. Call your health care provider if you have any problems or questions after your procedure.  WHAT TO EXPECT AFTER THE PROCEDURE After your procedure, it is typical to have the following:  You may have some cramping. This normally lasts for a couple days.  You may have bleeding. This can vary from light spotting for a few days to menstrual-like bleeding for 3-7 days. HOME CARE INSTRUCTIONS  Rest for the first 1-2 days after the procedure.  Only take over-the-counter or prescription medicines as directed by your health care provider. Do not take aspirin. It can increase the chances of bleeding.  Take showers instead of baths for 2 weeks or as directed by your health care provider.  Do not drive for 24 hours or as directed.  Do not drink alcohol while taking pain medicine.  Do not use tampons, douche, or have sexual intercourse for 2 weeks or until your health care provider says it is okay.  Take your temperature twice a day for 4-5 days. Write it down each time.  Follow your health care provider's advice about diet, exercise, and lifting.  If you develop constipation, you may:  Take a mild laxative if your health care provider approves.  Add bran foods to your diet.  Drink enough fluids to keep your urine clear or pale yellow.  Try to have someone with you or available to you for the first 24-48 hours, especially if you were given a general anesthetic.  Follow up with  your health care provider as directed. SEEK MEDICAL CARE IF:  You feel dizzy or lightheaded.  You feel sick to your stomach (nauseous).  You have abnormal vaginal discharge.  You have a rash.  You have pain that is not controlled with medicine. SEEK IMMEDIATE MEDICAL CARE IF:  You have bleeding that is heavier than a normal menstrual period.  You have a fever.  You have increasing cramps or pain, not controlled with medicine.  You have new belly (abdominal) pain.  You pass out.  You have pain in the tops of your shoulders (shoulder strap areas).  You have shortness of breath.   This information is not intended to replace advice given to you by your health care provider. Make sure you discuss any questions you have with your health care provider.   Document Released: 06/04/2013 Document Reviewed: 06/04/2013 Elsevier Interactive Patient Education 2016 [employer]  Diagnostic Laparoscopy A diagnostic laparoscopy is a procedure to diagnose diseases in the abdomen. During the procedure, a thin, lighted, pencil-sized instrument called a laparoscope is inserted into the abdomen through an incision. The laparoscope allows your health care provider to look at the organs inside your body. LET [facility] CARE PROVIDER KNOW ABOUT:  Any allergies you have.  All medicines you are taking, including vitamins, herbs, eye drops, creams, and over-the-counter medicines.  Previous problems you or members of your family have had with the use of anesthetics.  Any blood disorders you have.  Previous surgeries you have had.  Medical conditions you have. RISKS  AND COMPLICATIONS  Generally, this is a safe procedure. However, problems can occur, which may include:  Infection.  Bleeding.  Damage to other organs.  Allergic reaction to the anesthetics used during the procedure. BEFORE THE PROCEDURE  Do not eat or drink anything after midnight on the night before the procedure or as  directed by your health care provider.  Ask your health care provider about:  Changing or stopping your regular medicines.  Taking medicines such as aspirin and ibuprofen. These medicines can thin your blood. Do not take these medicines before your procedure if your health care provider instructs you not to.  Plan to have someone take you home after the procedure. PROCEDURE  You may be given a medicine to help you relax (sedative).  You will be given a medicine to make you sleep (general anesthetic).  Your abdomen will be inflated with a gas. This will make your organs easier to see.  Small incisions will be made in your abdomen.  A laparoscope and other small instruments will be inserted into the abdomen through the incisions.  A tissue sample may be removed from an organ in the abdomen for examination.  The instruments will be removed from the abdomen.  The gas will be released.  The incisions will be closed with stitches (sutures). AFTER THE PROCEDURE  Your blood pressure, heart rate, breathing rate, and blood oxygen level will be monitored often until the medicines you were given have worn off.   This information is not intended to replace advice given to you by your health care provider. Make sure you discuss any questions you have with your health care provider.   Document Released: 11/20/2000 Document Revised: 05/05/2015 Document Reviewed: 03/27/2014 Elsevier Interactive Patient Education [employer].

## 2015-10-27 ENCOUNTER — Encounter (HOSPITAL_COMMUNITY): Payer: Self-pay | Admitting: Obstetrics & Gynecology

## 2015-10-27 NOTE — Op Note (Signed)
ALMETTA GERVIN 10/26/2015  PREOPERATIVE DIAGNOSIS:  Mirena IUD migration; extrauterine   POSTOPERATIVE DIAGNOSIS:  SAA  PROCEDURE:  Diagnostic laparoscopy, diagnostic hysteroscopy with removal of IUD.  Intraoperative C arm fluoroscopy was used to localize IUD.  ANESTHESIA:  General endotracheal  COMPLICATIONS:  None immediate.  ESTIMATED BLOOD LOSS:  Less than 20 ml.  INDICATIONS: 25 y.o. with mental disability who had Mirena placed for hygienic reasons and heavy menstrual bleeding in OR in 2014; proper placement confirmed by U/S. The patient did well until 06/2015 when she represented with c/o recurrent heavy menstrual bleeding. U/S at that time confirmed proper placement and the plan was made to return to the OR for IUD removal and to resume Depo which controlled the patient's bleeding prior to IUD. In the OR, no strings were visible and hysteroscopic exam revealed no intrauterine IUD. XRay showed the IUD outside the uterus in the pelvis.  She presents today for laparoscopic removal of IUD.    FINDINGS: Normal anatomy with exception of migration of Mirena IUD which was located in the posterior lower uterine segment, inverted.  Normal uterus, fallopian tubes and cervix.  Normal appendix, gallbladder and liver edge.  TECHNIQUE:  The patient was taken to the operating room where general anesthesia was obtained without difficulty.  She was then placed in the dorsal lithotomy position and prepared and draped in sterile fashion.  After an adequate timeout was performed, a sponge stick was placed in the vagina for manipulation.  Attention was then turned to the patient's abdomen where a 10-mm skin incision was made on the umbilical fold.  The Veress needle was carefully introduced into the peritoneal cavity through the abdominal wall.  Intraperitoneal placement was confirmed by drop in intraabdominal pressure with insufflation of carbon dioxide gas.  Adequate pneumoperitoneum was obtained, and  the 38mm trocar was then advanced without difficulty into the abdomen where intraabdominal placement was confirmed by the operative laparoscope.  Entirely normal anatomy was noted.  A second port was placed in the suprapubic region, 95mm, under direct visualization to allow the placement of instruments for retraction.  Thorough survey revealed no IUD.  The C arm was called in to help localize the IUD under fluoroscopy.  The IUD was seen on fluoro in the pelvis.  A third port was placed in the right lower quadrant, 37mm, under direct visualization.  This allowed manipulation of anatomy under live fluoroscopy.  The IUD mobilized with uterine manipulation.  Extensive laparoscopic inspection of the uterus revealed no IUD. The decision was made to repeat hysteroscopy.    Sponge stick was removed from the vaginal.  A speculum was then placed in the patient's vagina and a single tooth tenaculum was applied to the anterior lip of the cervix.  The hysteroscope was inserted under direct visualization using saline as a suspension medium.  The uterine cavity was carefully examined, both ostia were recognized. Endometrium was atrophic in appearance compared with last hysteroscopic exam.  On removal of the hysteroscope, in the posterior lower uterine segment, the IUD was visualized.  The base was entirely embedded and the upper bar of the IUD was all that was seen.  A hysteroscopic grasper was advanced and the IUD was removed.  Inspection of the IUD revealed the entire base and strings surrounded by endometrium.  A second look hysteroscopy was taken to ensure hemostasis, which was confirmed.  Anesthesia administer 2 g Ancef at this time per my request.  The tenaculum was removed from the anterior lip  of the cervix and the vaginal speculum was removed after noting good hemostasis.   A final look laparoscopically was performed and no change in findings.  All three trocars were removed.  The infraumbilical fascial incision was  closed with an interrupted 0 Vicryl stitch.  All three skin incision were closed using 3-4 Monocryl in a subcuticular fashion.  Skin glue was applied.     The patient tolerated the procedure well and was taken to the recovery area awake, extubated and in stable condition.  Mirena IUD was placed in specimen cup and given to patient's mother.

## 2016-05-16 ENCOUNTER — Other Ambulatory Visit: Payer: Self-pay

## 2016-05-16 ENCOUNTER — Telehealth: Payer: Self-pay

## 2016-05-16 MED ORDER — DICLOFENAC EPOLAMINE 1.3 % TD PTCH: MEDICATED_PATCH | TRANSDERMAL | 11 refills | Status: DC

## 2016-05-16 NOTE — Telephone Encounter (Signed)
Spoke with pharmacy, resent the Rx for Flector patch, pharm tech stated they will fax over the PA form to be filled out [phone]

## 2016-05-19 ENCOUNTER — Telehealth: Payer: Self-pay | Admitting: *Deleted

## 2016-05-19 NOTE — Telephone Encounter (Signed)
Pt's mtr, Virgie called for status of the Prior Authorization for the [employer].

## 2016-05-30 ENCOUNTER — Other Ambulatory Visit: Payer: Self-pay | Admitting: *Deleted

## 2016-05-30 MED ORDER — DICLOFENAC EPOLAMINE 1.3 % TD PTCH: MEDICATED_PATCH | TRANSDERMAL | 11 refills | Status: DC

## 2016-05-30 NOTE — Telephone Encounter (Signed)
I called Medicaid and spoke to [facility]. to get prior authorization of prescription for Flector Patch.  Prescription was authorized.  The authorization number is [id number].  I called patient's pharmacy and spoke to KP and informed her of the new prescription and gave her authorization number.  I left patient's mother a message that prescription was approved and could be picked up from the pharmacy.

## 2016-08-14 ENCOUNTER — Ambulatory Visit: Payer: Medicaid Other | Admitting: Podiatry

## 2016-08-17 ENCOUNTER — Encounter: Payer: Self-pay | Admitting: Podiatry

## 2016-08-17 ENCOUNTER — Ambulatory Visit (INDEPENDENT_AMBULATORY_CARE_PROVIDER_SITE_OTHER): Payer: Medicaid Other | Admitting: Podiatry

## 2016-08-17 DIAGNOSIS — M79674 Pain in right toe(s): Secondary | ICD-10-CM | POA: Diagnosis not present

## 2016-08-17 DIAGNOSIS — M79675 Pain in left toe(s): Secondary | ICD-10-CM

## 2016-08-17 DIAGNOSIS — B351 Tinea unguium: Secondary | ICD-10-CM

## 2016-08-17 DIAGNOSIS — Q669 Congenital deformity of feet, unspecified, unspecified foot: Secondary | ICD-10-CM

## 2016-08-17 DIAGNOSIS — L600 Ingrowing nail: Secondary | ICD-10-CM

## 2016-08-17 DIAGNOSIS — M2030 Hallux varus (acquired), unspecified foot: Secondary | ICD-10-CM

## 2016-08-17 DIAGNOSIS — R269 Unspecified abnormalities of gait and mobility: Secondary | ICD-10-CM

## 2016-08-23 NOTE — Progress Notes (Signed)
Subjective: 25 year old female presents the office for mom from yearly evaluation symptomatic flat feet. She is continuing the custom inserts which are helping. Denies any pain to her feet denies any redness or swelling. She also continues to get ingrowing on her nails the nails are painful with pressure but denies any redness or drainage or any swelling. The mom states that once the nails trimmed the symptoms resolved. Denies any systemic complaints such as fevers, chills, nausea, vomiting. No acute changes since last appointment, and no other complaints at this time.   Objective: AAO x3, NAD DP/PT pulses palpable bilaterally, CRT less than 3 seconds There is a decrease in medial arch height upon weightbearing. Ankle, subtalar, midtarsal range of motion intact. Equinus is present. There is no area pinpoint bony tenderness or pain the vibratory sensation. Bilateral hallux nails are mildly hypertrophic, dystrophic, brittle, yellow discoloration, elongated 2. There is mild incurvation of the hallux bilaterally. No surrounding redness or drainage. Tenderness to the hallux nails bilaterally. No open lesions or pre-ulcerative lesions.  No pain with calf compression, swelling, warmth, erythema  Assessment: Flatfoot deformity bilateral with ingrown toenail spell lateral hallux  Plan: -All treatment options discussed with the patient including all alternatives, risks, complications.  -Nails are sharply debrided 2 without complications or bleeding. -Prescription for new inserts were given to the patient. -Discussed shoe gear changes as well -Follow-up in one years sooner if needed. Call any questions concerns meantime. -Patient encouraged to call the office with any questions, concerns, change in symptoms.   Celesta Gentile, DPM

## 2017-06-07 ENCOUNTER — Other Ambulatory Visit: Payer: Self-pay | Admitting: Podiatry

## 2017-06-07 ENCOUNTER — Telehealth: Payer: Self-pay | Admitting: Podiatry

## 2017-06-07 NOTE — Telephone Encounter (Signed)
I'm calling for my daughter who is a pt of Dr. Leigh Aurora. About a week ago the pharmacy sent over a pre-authorization for the flexor patch and they have not received or heard anything back. Can you please get that sent back to the pharmacy which is [facility] on [address]. My number is [phone]. Thank you. Bye.

## 2017-06-08 MED ORDER — DICLOFENAC EPOLAMINE 1.3 % TD PTCH: 1.0000 | MEDICATED_PATCH | Freq: Two times a day (BID) | TRANSDERMAL | 11 refills | Status: DC

## 2017-06-08 NOTE — Addendum Note (Signed)
Addended by: Harriett Sine D on: 06/08/2017 10:17 AM   Modules accepted: Orders

## 2017-06-08 NOTE — Telephone Encounter (Addendum)
Pt's mtr, Virgie called for prior authorization of Flector patch 1.3%.  I spoke with Butch Penny - [place] and she states she will give [facility] the information, that pt had been on the patch for over 4 years, was to use one daily and had 11additional refills, and we could check status of PA code# [id number] on 06/11/2017, Ticket# [id number]. 06/12/2017-Selena - [employer] states the Flector 1.3% patch was approved Authorization [id number]. Faxed to [employer].

## 2017-07-10 DIAGNOSIS — R32 Unspecified urinary incontinence: Secondary | ICD-10-CM | POA: Insufficient documentation

## 2017-07-10 DIAGNOSIS — F840 Autistic disorder: Secondary | ICD-10-CM | POA: Insufficient documentation

## 2017-07-10 DIAGNOSIS — M1990 Unspecified osteoarthritis, unspecified site: Secondary | ICD-10-CM | POA: Insufficient documentation

## 2017-07-10 DIAGNOSIS — F79 Unspecified intellectual disabilities: Secondary | ICD-10-CM | POA: Insufficient documentation

## 2017-07-17 ENCOUNTER — Encounter (HOSPITAL_BASED_OUTPATIENT_CLINIC_OR_DEPARTMENT_OTHER): Payer: Self-pay | Admitting: *Deleted

## 2017-07-18 ENCOUNTER — Other Ambulatory Visit: Payer: Self-pay

## 2017-07-18 ENCOUNTER — Encounter (HOSPITAL_BASED_OUTPATIENT_CLINIC_OR_DEPARTMENT_OTHER): Payer: Self-pay | Admitting: *Deleted

## 2017-07-18 NOTE — Progress Notes (Signed)
Dr. Jillyn Hidden reviewed H&P from primary - ok for surgery, no labs needed. Bring all medications.

## 2017-07-18 NOTE — H&P (Signed)
This is a 26y/o special needs B female who presents with two teeth that need extraction.  She is unable to be cooperative in the office. An exam was extremely difficult. Do to the above a general anesthesia was recommended.

## 2017-07-18 NOTE — H&P (Signed)
Patricia Santos is an 26 y.o. female.   Chief Complaint: two non restorable teeth HPI: several months  Past Medical History:  Diagnosis Date  . Arthritis    feet  . Autistic disorder   . Complication of anesthesia    need to tape eyelids closed during surgery; eyelids do not close completely since ptosis surgery  . Constipation   . Difficulty swallowing pills    mixes medication with food  . History of petit-mal seizures    no seizures since 2007  . Mental retardation   . Seasonal allergies   . Teeth grinding   . Urinary incontinence     Past Surgical History:  Procedure Laterality Date  . BELPHAROPTOSIS REPAIR Bilateral   . HYSTEROSCOPY W/D&C N/A 10/26/2015   Procedure: DILATATION AND CURETTAGE /HYSTEROSCOPY;  Surgeon: Linda Hedges, DO;  Location: [facility];  Service: Gynecology;  Laterality: N/A;  . INTRAUTERINE DEVICE (IUD) INSERTION N/A 05/02/2013   Procedure: INTRAUTERINE DEVICE (IUD) INSERTION;  Surgeon: Lahoma Crocker, MD;  Location: [facility];  Service: Gynecology;  Laterality: N/A;  . IUD REMOVAL N/A 10/20/2015   Procedure: Attempted INTRAUTERINE DEVICE (IUD) REMOVAL with Hysteroscopy;  Surgeon: Linda Hedges, DO;  Location: [facility];  Service: Gynecology;  Laterality: N/A;  . IUD REMOVAL N/A 10/26/2015   Procedure: INTRAUTERINE DEVICE (IUD) REMOVAL;  Surgeon: Linda Hedges, DO;  Location: [facility];  Service: Gynecology;  Laterality: N/A;  . LAPAROSCOPY N/A 10/26/2015   Procedure: LAPAROSCOPY DIAGNOSTIC with IUD removalwith C Arm;  Surgeon: Linda Hedges, DO;  Location: [facility];  Service: Gynecology;  Laterality: N/A;  . MULTIPLE TOOTH EXTRACTIONS  04/28/2009   #1, #17, #32  . MUSCLE BIOPSY    . TOENAIL EXCISION Bilateral    for ingrown toenails  . TOOTH EXTRACTION N/A 04/08/2014   Procedure: DENTAL RESTORATION/EXTRACTIONS;  Surgeon: Doroteo Glassman, DDS;  Location: [place];  Service: Dentistry;  Laterality: N/A;  . WISDOM TOOTH EXTRACTION      Family History    Problem Relation Age of Onset  . Hyperthyroidism Father   . Hyperthyroidism Maternal Aunt   . Diabetes Maternal Grandmother   . Hypertension Maternal Grandmother    Social History:  reports that  has never smoked. she has never used smokeless tobacco. She reports that she does not drink alcohol or use drugs.  Allergies:  Allergies  Allergen Reactions  . Banana Other (See Comments)    GI UPSET  . Milk-Related Compounds Other (See Comments)    GI UPSET    No medications prior to admission.    No results found for this or any previous visit (from the past 48 hour(s)). No results found.  ROS  Height 5\' 5"  (1.651 m), weight 73.5 kg (162 lb). Physical Exam  HENT:  Mouth/Throat:       Assessment/Plan Surgical removal of two teeth under general anesthesia  Ceasar Mons, DDS 07/18/2017, 12:24 PM

## 2017-07-25 ENCOUNTER — Ambulatory Visit (HOSPITAL_BASED_OUTPATIENT_CLINIC_OR_DEPARTMENT_OTHER): Payer: Medicaid Other | Admitting: Certified Registered"

## 2017-07-25 ENCOUNTER — Other Ambulatory Visit: Payer: Self-pay

## 2017-07-25 ENCOUNTER — Encounter (HOSPITAL_BASED_OUTPATIENT_CLINIC_OR_DEPARTMENT_OTHER): Payer: Self-pay | Admitting: *Deleted

## 2017-07-25 ENCOUNTER — Ambulatory Visit (HOSPITAL_BASED_OUTPATIENT_CLINIC_OR_DEPARTMENT_OTHER)
Admission: RE | Admit: 2017-07-25 | Discharge: 2017-07-25 | Disposition: A | Discharge status: Home / Self Care | Payer: Medicaid Other | Source: Ambulatory Visit | Attending: Oral Surgery | Admitting: Oral Surgery

## 2017-07-25 ENCOUNTER — Encounter (HOSPITAL_BASED_OUTPATIENT_CLINIC_OR_DEPARTMENT_OTHER)
Admission: RE | Disposition: A | Discharge status: Home / Self Care | Payer: Self-pay | Source: Ambulatory Visit | Attending: Oral Surgery

## 2017-07-25 DIAGNOSIS — K029 Dental caries, unspecified: Secondary | ICD-10-CM | POA: Diagnosis present

## 2017-07-25 DIAGNOSIS — F79 Unspecified intellectual disabilities: Secondary | ICD-10-CM | POA: Diagnosis not present

## 2017-07-25 HISTORY — PX: TOOTH EXTRACTION: SHX859

## 2017-07-25 HISTORY — DX: Unspecified urinary incontinence: R32

## 2017-07-25 SURGERY — EXTRACTION, TOOTH, MOLAR
Anesthesia: General | Site: Mouth

## 2017-07-25 MED ORDER — KETOROLAC TROMETHAMINE 30 MG/ML IJ SOLN
INTRAMUSCULAR | Status: AC
  Filled 2017-07-25: qty 1

## 2017-07-25 MED ORDER — SUGAMMADEX SODIUM 200 MG/2ML IV SOLN
INTRAVENOUS | Status: DC | PRN
  Administered 2017-07-25: 308.8 mg via INTRAVENOUS

## 2017-07-25 MED ORDER — ROCURONIUM BROMIDE 10 MG/ML (PF) SYRINGE
PREFILLED_SYRINGE | INTRAVENOUS | Status: AC
  Filled 2017-07-25: qty 5

## 2017-07-25 MED ORDER — SCOPOLAMINE 1 MG/3DAYS TD PT72: 1.0000 | MEDICATED_PATCH | Freq: Once | TRANSDERMAL | Status: DC | PRN

## 2017-07-25 MED ORDER — DEXAMETHASONE SODIUM PHOSPHATE 4 MG/ML IJ SOLN
INTRAMUSCULAR | Status: DC | PRN
  Administered 2017-07-25: 10 mg via INTRAVENOUS

## 2017-07-25 MED ORDER — ONDANSETRON HCL 4 MG/2ML IJ SOLN
INTRAMUSCULAR | Status: DC | PRN
  Administered 2017-07-25: 4 mg via INTRAVENOUS

## 2017-07-25 MED ORDER — GLYCOPYRROLATE 0.2 MG/ML IJ SOLN
INTRAMUSCULAR | Status: DC | PRN
  Administered 2017-07-25: 0.2 mg via INTRAVENOUS

## 2017-07-25 MED ORDER — ONDANSETRON HCL 4 MG/2ML IJ SOLN
INTRAMUSCULAR | Status: AC
Start: 2017-07-25 — End: 2017-07-25
  Filled 2017-07-25: qty 2

## 2017-07-25 MED ORDER — KETOROLAC TROMETHAMINE 30 MG/ML IJ SOLN
INTRAMUSCULAR | Status: DC | PRN
  Administered 2017-07-25: 30 mg via INTRAVENOUS

## 2017-07-25 MED ORDER — MIDAZOLAM HCL 2 MG/ML PO SYRP
ORAL_SOLUTION | ORAL | Status: AC
  Filled 2017-07-25: qty 5

## 2017-07-25 MED ORDER — 0.9 % SODIUM CHLORIDE (POUR BTL) OPTIME
TOPICAL | Status: DC | PRN
  Administered 2017-07-25: 50 mL

## 2017-07-25 MED ORDER — PROPOFOL 10 MG/ML IV BOLUS
INTRAVENOUS | Status: AC
  Filled 2017-07-25: qty 20

## 2017-07-25 MED ORDER — PROPOFOL 10 MG/ML IV BOLUS
INTRAVENOUS | Status: DC | PRN
  Administered 2017-07-25: 50 mg via INTRAVENOUS

## 2017-07-25 MED ORDER — MIDAZOLAM HCL 2 MG/2ML IJ SOLN: 1.0000 mg | INTRAMUSCULAR | Status: DC | PRN

## 2017-07-25 MED ORDER — DEXAMETHASONE SODIUM PHOSPHATE 10 MG/ML IJ SOLN
INTRAMUSCULAR | Status: AC
  Filled 2017-07-25: qty 1

## 2017-07-25 MED ORDER — MIDAZOLAM HCL 2 MG/2ML IJ SOLN
INTRAMUSCULAR | Status: AC
  Filled 2017-07-25: qty 2

## 2017-07-25 MED ORDER — LIDOCAINE-EPINEPHRINE 2 %-1:100000 IJ SOLN
INTRAMUSCULAR | Status: AC
  Filled 2017-07-25: qty 13.6

## 2017-07-25 MED ORDER — ROCURONIUM BROMIDE 100 MG/10ML IV SOLN
INTRAVENOUS | Status: DC | PRN
  Administered 2017-07-25: 50 mg via INTRAVENOUS

## 2017-07-25 MED ORDER — GLYCOPYRROLATE 0.2 MG/ML IJ SOLN
INTRAMUSCULAR | Status: AC
  Filled 2017-07-25: qty 1

## 2017-07-25 MED ORDER — MIDAZOLAM HCL 2 MG/ML PO SYRP
20.0000 mg | ORAL_SOLUTION | Freq: Once | ORAL | Status: AC
  Administered 2017-07-25: 20 mg via ORAL

## 2017-07-25 MED ORDER — KETAMINE HCL 100 MG/ML IJ SOLN
INTRAMUSCULAR | Status: AC
  Filled 2017-07-25: qty 1

## 2017-07-25 MED ORDER — KETAMINE HCL 100 MG/ML IJ SOLN
INTRAMUSCULAR | Status: DC | PRN
  Administered 2017-07-25: 200 mg via INTRAVENOUS

## 2017-07-25 MED ORDER — LACTATED RINGERS IV SOLN
INTRAVENOUS | Status: DC
  Administered 2017-07-25: 08:00:00 via INTRAVENOUS

## 2017-07-25 MED ORDER — NEOSTIGMINE METHYLSULFATE 5 MG/5ML IV SOSY
PREFILLED_SYRINGE | INTRAVENOUS | Status: AC
  Filled 2017-07-25: qty 5

## 2017-07-25 MED ORDER — BUPIVACAINE-EPINEPHRINE (PF) 0.5% -1:200000 IJ SOLN
INTRAMUSCULAR | Status: AC
  Filled 2017-07-25: qty 7.2

## 2017-07-25 MED ORDER — FENTANYL CITRATE (PF) 100 MCG/2ML IJ SOLN
INTRAMUSCULAR | Status: AC
  Filled 2017-07-25: qty 2

## 2017-07-25 MED ORDER — FENTANYL CITRATE (PF) 100 MCG/2ML IJ SOLN: 50.0000 ug | INTRAMUSCULAR | Status: DC | PRN

## 2017-07-25 MED ORDER — LIDOCAINE-EPINEPHRINE 2 %-1:100000 IJ SOLN
INTRAMUSCULAR | Status: DC | PRN
  Administered 2017-07-25: 3 mL

## 2017-07-25 SURGICAL SUPPLY — 38 items
BLADE SURG 15 STRL LF DISP TIS (BLADE) ×1 IMPLANT
BLADE SURG 15 STRL SS (BLADE) ×3
BUR 701 1.2X59 5PK (BURR) IMPLANT
BUR 702 1.6X59 5PK (BURR) IMPLANT
BUR 8 ROUND 2.3X65 5PK (BURR) IMPLANT
BUR OVAL 4.0MMX59MM (BURR)
BUR OVAL 4.0X59 (BURR) IMPLANT
CANISTER SUCT 1200ML W/VALVE (MISCELLANEOUS) ×3 IMPLANT
CATH ROBINSON RED A/P 10FR (CATHETERS) IMPLANT
COVER BACK TABLE 60X90IN (DRAPES) ×3 IMPLANT
COVER MAYO STAND STRL (DRAPES) ×3 IMPLANT
DRAPE U-SHAPE 76X120 STRL (DRAPES) ×3 IMPLANT
GAUZE PACKING IODOFORM 1/4X15 (GAUZE/BANDAGES/DRESSINGS) IMPLANT
GLOVE BIO SURGEON STRL SZ 6.5 (GLOVE) ×4 IMPLANT
GLOVE BIO SURGEON STRL SZ7.5 (GLOVE) ×3 IMPLANT
GLOVE BIO SURGEONS STRL SZ 6.5 (GLOVE) ×2
GOWN STRL REUS W/ TWL LRG LVL3 (GOWN DISPOSABLE) ×2 IMPLANT
GOWN STRL REUS W/ TWL XL LVL3 (GOWN DISPOSABLE) ×1 IMPLANT
GOWN STRL REUS W/TWL LRG LVL3 (GOWN DISPOSABLE) ×6
GOWN STRL REUS W/TWL XL LVL3 (GOWN DISPOSABLE) ×3
IV NS 500ML (IV SOLUTION)
IV NS 500ML BAXH (IV SOLUTION) IMPLANT
NDL DENTAL 27 LONG (NEEDLE) ×1 IMPLANT
NEEDLE DENTAL 27 LONG (NEEDLE) ×3 IMPLANT
NS IRRIG 1000ML POUR BTL (IV SOLUTION) ×3 IMPLANT
PACK BASIN DAY SURGERY FS (CUSTOM PROCEDURE TRAY) ×3 IMPLANT
SPONGE SURGIFOAM ABS GEL 12-7 (HEMOSTASIS) IMPLANT
SUT CHROMIC 3 0 PS 2 (SUTURE) ×2 IMPLANT
SUT CHROMIC 4 0 P 3 18 (SUTURE) IMPLANT
SUT SILK 3 0 PS 1 (SUTURE) IMPLANT
SYR 50ML LL SCALE MARK (SYRINGE) ×6 IMPLANT
TOOTHBRUSH ADULT (PERSONAL CARE ITEMS) IMPLANT
TOWEL OR 17X24 6PK STRL BLUE (TOWEL DISPOSABLE) ×4 IMPLANT
TOWEL OR NON WOVEN STRL DISP B (DISPOSABLE) ×3 IMPLANT
TUBE CONNECTING 20'X1/4 (TUBING) ×1
TUBE CONNECTING 20X1/4 (TUBING) ×2 IMPLANT
VENT IRR SPI W TUB AD (MISCELLANEOUS) ×1 IMPLANT
YANKAUER SUCT BULB TIP NO VENT (SUCTIONS) ×3 IMPLANT

## 2017-07-25 NOTE — Anesthesia Postprocedure Evaluation (Signed)
Anesthesia Post Note  Patient: NYEMAH WATTON  Procedure(s) Performed: EXTRACTION MOLARS NUMBERS 16 AND 31 (N/A Mouth)     Patient location during evaluation: PACU Anesthesia Type: General Level of consciousness: awake and alert Pain management: pain level controlled Vital Signs Assessment: post-procedure vital signs reviewed and stable Respiratory status: spontaneous breathing, nonlabored ventilation, respiratory function stable and patient connected to nasal cannula oxygen Cardiovascular status: blood pressure returned to baseline and stable Postop Assessment: no apparent nausea or vomiting Anesthetic complications: no    Last Vitals:  Vitals:   07/25/17 0900 07/25/17 0909  BP: (!) 153/94   Pulse: (!) 127 (!) 133  Resp: (!) 30 (!) 24  Temp:    SpO2: 100% 100%    Last Pain:  Vitals:   07/25/17 0639  TempSrc: Oral                 Montez Hageman

## 2017-07-25 NOTE — Transfer of Care (Signed)
Immediate Anesthesia Transfer of Care Note  Patient: Patricia Santos  Procedure(s) Performed: EXTRACTION MOLARS NUMBERS 16 AND 31 (N/A Mouth)  Patient Location: PACU  Anesthesia Type:General  Level of Consciousness: awake, drowsy and patient cooperative  Airway & Oxygen Therapy: Patient Spontanous Breathing and Patient connected to face mask oxygen  Post-op Assessment: Report given to RN and Post -op Vital signs reviewed and stable  Post vital signs: Reviewed and stable  Last Vitals:  Vitals:   07/25/17 0639 07/25/17 0830  BP: (!) 157/86 (!) 124/107  Pulse: (!) 118 (!) 141  Resp: 16 16  Temp: 36.6 C   SpO2: 100% 100%    Last Pain:  Vitals:   07/25/17 0639  TempSrc: Oral         Complications: No apparent anesthesia complications

## 2017-07-25 NOTE — Discharge Instructions (Signed)

## 2017-07-25 NOTE — Op Note (Signed)
Patient did not respond well to the Versed preop. She was given 200 mg of ketamine which allowed her to be brought to the operating room in a supine position and which remained throughout the whole procedure. She was intubated via right nasoendotracheal tube and then prepped and draped in usual fashion for an intraoral procedure. The throat was suctioned out and packed with a moist open 4 x 4 gauze. 2 carpules of 2% Xylocaine with 1-100,000 epinephrine was given as a block on the lower right side and infiltration of the left maxilla and palate. An intraoral exam did not reveal any other decayed teeth with the exception of #16 and #31. A periosteal elevator when around the decayed tooth #16. It was mobilized with a #92 elevator and removed with an upper universal forceps. The bone was trimmed with a rongeur. The socket was curetted and closed with a 3-0 chromic suture. A periosteal elevator went around #31. It was mobilized with a #92 elevator and removed with a #23 forceps. The socket was curetted and irrigated. The soft tissue was and closed with a 3-0 chromic suture. The tooth was also decayed. There was good hemostasis. The throat pack was removed. The patient was extubated on the table and returned to the recovery room in good condition. She'll be followed by me in my private office.

## 2017-07-25 NOTE — H&P (Signed)
There is no change in the medical history.  The family understands the procedure.

## 2017-07-25 NOTE — Anesthesia Preprocedure Evaluation (Signed)
Anesthesia Evaluation  Patient identified by MRN, date of birth, ID band Patient awake    Reviewed: Allergy & Precautions, NPO status , Patient's Chart, lab work & pertinent test results  Airway Mallampati: II  TM Distance: >3 FB Neck ROM: Full    Dental no notable dental hx.    Pulmonary neg pulmonary ROS,    Pulmonary exam normal breath sounds clear to auscultation       Cardiovascular negative cardio ROS Normal cardiovascular exam Rhythm:Regular Rate:Normal     Neuro/Psych AUTISM negative psych ROS   GI/Hepatic negative GI ROS, Neg liver ROS,   Endo/Other  negative endocrine ROS  Renal/GU negative Renal ROS  negative genitourinary   Musculoskeletal negative musculoskeletal ROS (+)   Abdominal   Peds negative pediatric ROS (+)  Hematology negative hematology ROS (+)   Anesthesia Other Findings   Reproductive/Obstetrics negative OB ROS                             Anesthesia Physical Anesthesia Plan  ASA: I  Anesthesia Plan: General   Post-op Pain Management:    Induction: Intravenous  PONV Risk Score and Plan: 3 and Ondansetron, Dexamethasone, Midazolam and Treatment may vary due to age or medical condition  Airway Management Planned: Nasal ETT  Additional Equipment:   Intra-op Plan:   Post-operative Plan: Extubation in OR  Informed Consent: I have reviewed the patients History and Physical, chart, labs and discussed the procedure including the risks, benefits and alternatives for the proposed anesthesia with the patient or authorized representative who has indicated his/her understanding and acceptance.   Dental advisory given  Plan Discussed with: CRNA  Anesthesia Plan Comments:         Anesthesia Quick Evaluation

## 2017-07-25 NOTE — Brief Op Note (Signed)
07/25/2017  8:15 AM  PATIENT:  Patricia Santos  26 y.o. female  PRE-OPERATIVE DIAGNOSIS:  IMPACTED TEETH, SPECIAL NEEDS  POST-OPERATIVE DIAGNOSIS:  IMPACTED TEETH, SPECIAL NEEDS  PROCEDURE:  Procedure(s): EXTRACTION MOLARS NUMBERS 16 AND 31 (N/A)  SURGEON:  Surgeon(s) and Role:    Jannette Fogo, DDS - Primary  PHYSICIAN ASSISTANT:   ASSISTANTS: Levonne Hubert   ANESTHESIA:   general  EBL:  25cc   BLOOD ADMINISTERED:none  DRAINS: none   LOCAL MEDICATIONS USED:  XYLOCAINE   SPECIMEN:  No Specimen  DISPOSITION OF SPECIMEN:  N/A  COUNTS:  YES  TOURNIQUET:  * No tourniquets in log *  DICTATION: .Dragon Dictation  PLAN OF CARE: Discharge to home after PACU  PATIENT DISPOSITION:  PACU - hemodynamically stable.   Delay start of Pharmacological VTE agent (>24hrs) due to surgical blood loss or risk of bleeding: not applicable

## 2017-07-25 NOTE — Anesthesia Procedure Notes (Signed)
Procedure Name: Intubation Performed by: Verita Lamb, CRNA Pre-anesthesia Checklist: Patient identified, Emergency Drugs available, Suction available, Patient being monitored and Timeout performed Patient Re-evaluated:Patient Re-evaluated prior to induction Preoxygenation: Pre-oxygenation with 100% oxygen Induction Type: IV induction and Inhalational induction Ventilation: Mask ventilation without difficulty Laryngoscope Size: Mac and 3 Grade View: Grade I Nasal Tubes: Right and Magill forceps- large, utilized Tube size: 7.5 mm Number of attempts: 1 Placement Confirmation: ETT inserted through vocal cords under direct vision,  positive ETCO2,  CO2 detector and breath sounds checked- equal and bilateral Tube secured with: Tape Dental Injury: Teeth and Oropharynx as per pre-operative assessment

## 2017-07-25 NOTE — H&P (Signed)
Anesthesia H&P Update: History and Physical Exam reviewed; patient is OK for planned anesthetic and procedure. ? ?

## 2017-07-26 ENCOUNTER — Encounter (HOSPITAL_BASED_OUTPATIENT_CLINIC_OR_DEPARTMENT_OTHER): Payer: Self-pay | Admitting: Oral Surgery

## 2017-07-30 IMAGING — XA DG C-ARM 1-60 MIN-NO REPORT
2 series · 2 of 2 positions shown · non-contrast
Comparison: none

[Series 1: cont. · 1 of 1 slices shown (1 of 2)]
[im 1/1]
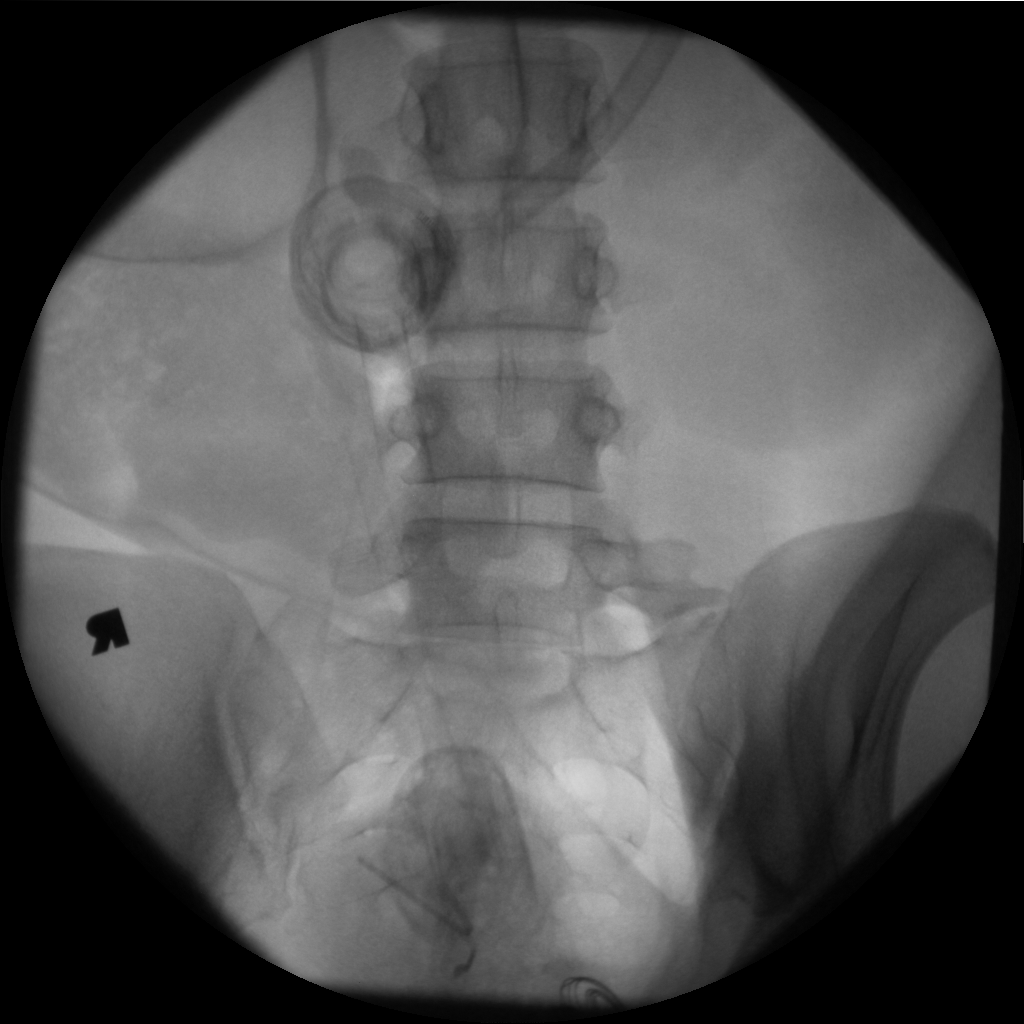

[Series 2: cont. · 1 of 1 slices shown (2 of 2)]
[im 1/1]
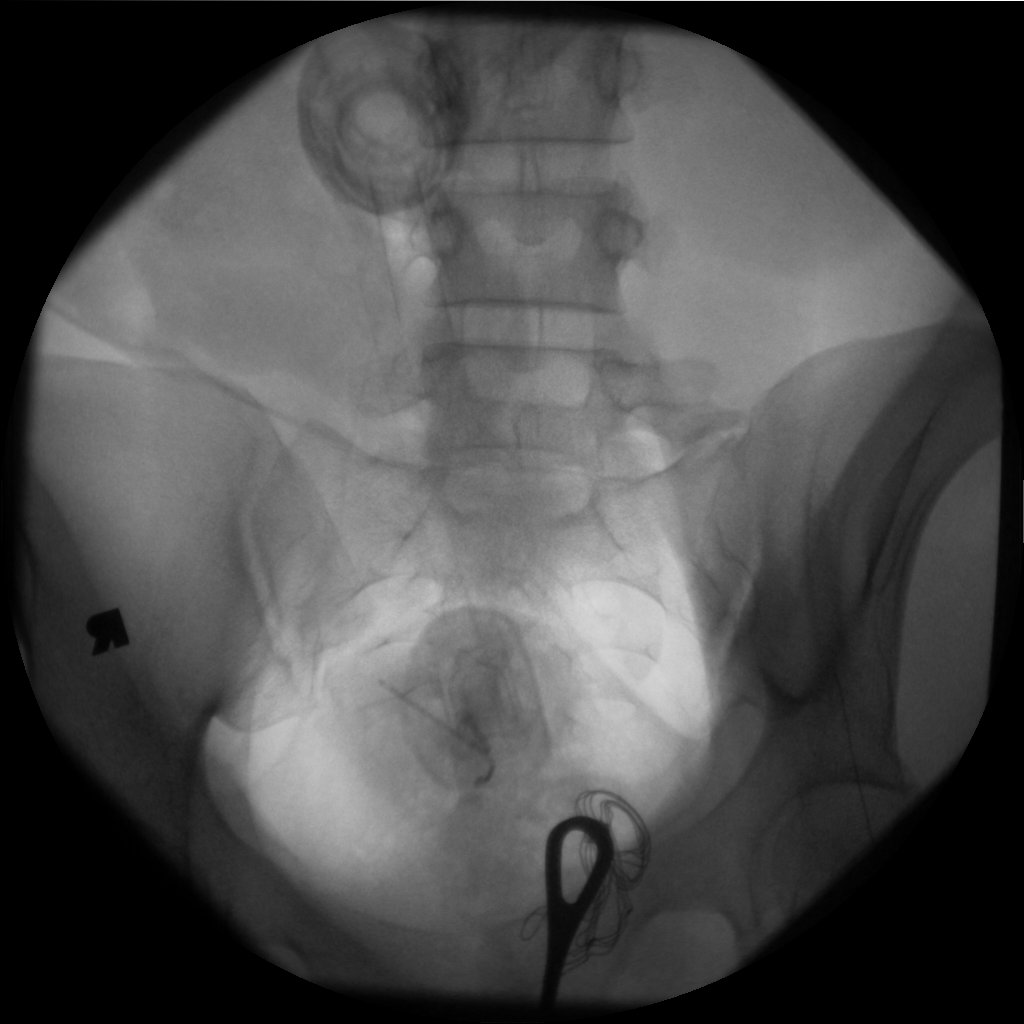

[2 of 2 positions shown; findings below may reference images not displayed]

Canned report from images found in remote index.

Refer to host system for actual result text.

## 2017-08-16 ENCOUNTER — Ambulatory Visit: Payer: Self-pay | Admitting: Podiatry

## 2017-08-17 ENCOUNTER — Ambulatory Visit: Payer: Medicaid Other | Admitting: Podiatry

## 2017-09-07 ENCOUNTER — Encounter: Payer: Self-pay | Admitting: Podiatry

## 2017-09-07 ENCOUNTER — Ambulatory Visit: Payer: Medicaid Other | Admitting: Podiatry

## 2017-09-07 DIAGNOSIS — R269 Unspecified abnormalities of gait and mobility: Secondary | ICD-10-CM | POA: Diagnosis not present

## 2017-09-07 DIAGNOSIS — Q669 Congenital deformity of feet, unspecified, unspecified foot: Secondary | ICD-10-CM

## 2017-09-07 DIAGNOSIS — M79674 Pain in right toe(s): Secondary | ICD-10-CM

## 2017-09-07 DIAGNOSIS — B351 Tinea unguium: Secondary | ICD-10-CM | POA: Diagnosis not present

## 2017-09-07 DIAGNOSIS — M79675 Pain in left toe(s): Secondary | ICD-10-CM | POA: Diagnosis not present

## 2017-09-07 DIAGNOSIS — L600 Ingrowing nail: Secondary | ICD-10-CM | POA: Diagnosis not present

## 2017-09-10 NOTE — Progress Notes (Signed)
Subjective: 27 year old female presents the office for mom from yearly evaluation symptomatic flat feet and for thick toenails that are painful with pressure in shoes.  Patient's mom denies any acute changes since last appointment and has no new concerns.  She does continue Flector patches for her feet which helped.  She is asking for a new prescription for orthotics. She gets this from [facility]. Denies any systemic complaints such as fevers, chills, nausea, vomiting. No acute changes since last appointment, and no other complaints at this time.   Objective: AAO x3, NAD DP/PT pulses palpable bilaterally, CRT less than 3 seconds There is a decrease in medial arch height upon weightbearing. Ankle, subtalar, midtarsal range of motion intact. Equinus is present. There is no area pinpoint bony tenderness and there is no overlying edema, erythema, increase in warmth. Bilateral hallux nails are mildly hypertrophic, dystrophic, brittle, yellow discoloration, elongated 2. There is mild incurvation of the hallux bilaterally. No surrounding redness or drainage. Tenderness to the hallux nails bilaterally. No open lesions or pre-ulcerative lesions.  No pain with calf compression, swelling, warmth, erythema  Assessment: Flatfoot deformity bilateral with ingrown toenail spell lateral hallux  Plan: -All treatment options discussed with the patient including all alternatives, risks, complications.  -Nails are sharply debrided 2 without complications or bleeding. -Prescription for new inserts were given to the patient. -Discussed shoe gear changes as well -Flector patches as needed for pain -Follow-up in one year or sooner if needed. Call any questions concerns meantime. -Patient encouraged to call the office with any questions, concerns, change in symptoms.   Celesta Gentile, DPM

## 2018-01-31 ENCOUNTER — Encounter (HOSPITAL_COMMUNITY): Payer: Self-pay | Admitting: Family Medicine

## 2018-01-31 ENCOUNTER — Ambulatory Visit (HOSPITAL_COMMUNITY)
Admission: EM | Admit: 2018-01-31 | Discharge: 2018-01-31 | Disposition: A | Discharge status: Home / Self Care | Payer: Medicaid Other | Attending: Physician Assistant | Admitting: Physician Assistant

## 2018-01-31 DIAGNOSIS — J0110 Acute frontal sinusitis, unspecified: Secondary | ICD-10-CM | POA: Diagnosis not present

## 2018-01-31 MED ORDER — SULFACETAMIDE SODIUM 10 % OP SOLN: 1.0000 [drp] | OPHTHALMIC | 0 refills | Status: AC

## 2018-01-31 MED ORDER — FLUCONAZOLE 40 MG/ML PO SUSR: ORAL | 0 refills | Status: DC

## 2018-01-31 MED ORDER — AMOXICILLIN-POT CLAVULANATE 400-57 MG/5ML PO SUSR: 875.0000 mg | Freq: Two times a day (BID) | ORAL | 0 refills | Status: AC

## 2018-01-31 NOTE — ED Triage Notes (Addendum)
Pt here for nasal congestion and drainage coming from nose x 3 weeks. She has been taking zyrtec, pataday and benadryl but not better. Some coughing. Denies fever. sts using warm compress for eye. This eye drainage started today,

## 2018-01-31 NOTE — Discharge Instructions (Signed)
Start augmentin for sinus infection. Continue zyrtec, you can add on sudafed short term to help with nasal drainage. You can use steam shower and bulb syringe to help as well. Keep hydrated, your urine should be clear to pale yellow in color.  Follow up for reevaluation if symptoms not improving. Monitor for any worsening of symptoms, chest pain, shortness of breath, wheezing, swelling of the throat, follow up for reevaluation.   Use sulfacetamide eyedrops as directed on left  eye. Lid scrubs and warm compresses as directed. Monitor for any worsening of symptoms, changes in vision, sensitivity to light, eye swelling, painful eye movement, follow up with ophthalmology for further evaluation.

## 2018-01-31 NOTE — ED Provider Notes (Addendum)
[place]    CSN: [id number] Arrival date & time: 01/31/18  9562     History   Chief Complaint Chief Complaint  Patient presents with  . Nasal Congestion  . Allergies    HPI Patricia Santos is a 27 y.o. female.   27 year old female with history of autistic disorder comes in with mother for 3-week history of nasal congestion and drainage.  Patient woke up this morning with left eye redness and crusting.  No obvious pain, irritation, photophobia, vision changes.  Patient with intermittent cough.  Denies fever, chills, night sweats.  She has been taking Zyrtec, Pataday, Benadryl without relief.  Mother states patient has trouble blowing her nose, and does not tolerate nasal sprays.      Past Medical History:  Diagnosis Date  . Arthritis    feet  . Autistic disorder   . Complication of anesthesia    need to tape eyelids closed during surgery; eyelids do not close completely since ptosis surgery  . Constipation   . Difficulty swallowing pills    mixes medication with food  . History of petit-mal seizures    no seizures since 2007  . Mental retardation   . Seasonal allergies   . Teeth grinding   . Urinary incontinence     There are no active problems to display for this patient.   Past Surgical History:  Procedure Laterality Date  . BELPHAROPTOSIS REPAIR Bilateral   . HYSTEROSCOPY W/D&C N/A 10/26/2015   Procedure: DILATATION AND CURETTAGE /HYSTEROSCOPY;  Surgeon: Linda Hedges, DO;  Location: [facility];  Service: Gynecology;  Laterality: N/A;  . INTRAUTERINE DEVICE (IUD) INSERTION N/A 05/02/2013   Procedure: INTRAUTERINE DEVICE (IUD) INSERTION;  Surgeon: Lahoma Crocker, MD;  Location: [facility];  Service: Gynecology;  Laterality: N/A;  . IUD REMOVAL N/A 10/20/2015   Procedure: Attempted INTRAUTERINE DEVICE (IUD) REMOVAL with Hysteroscopy;  Surgeon: Linda Hedges, DO;  Location: [facility];  Service: Gynecology;  Laterality: N/A;  . IUD REMOVAL N/A 10/26/2015   Procedure: INTRAUTERINE DEVICE (IUD) REMOVAL;  Surgeon: Linda Hedges, DO;  Location: [facility];  Service: Gynecology;  Laterality: N/A;  . LAPAROSCOPY N/A 10/26/2015   Procedure: LAPAROSCOPY DIAGNOSTIC with IUD removalwith C Arm;  Surgeon: Linda Hedges, DO;  Location: [facility];  Service: Gynecology;  Laterality: N/A;  . MULTIPLE TOOTH EXTRACTIONS  04/28/2009   #1, #17, #32  . MUSCLE BIOPSY    . TOENAIL EXCISION Bilateral    for ingrown toenails  . TOOTH EXTRACTION N/A 04/08/2014   Procedure: DENTAL RESTORATION/EXTRACTIONS;  Surgeon: Doroteo Glassman, DDS;  Location: [facility];  Service: Dentistry;  Laterality: N/A;  . TOOTH EXTRACTION N/A 07/25/2017   Procedure: EXTRACTION MOLARS NUMBERS 16 AND 31;  Surgeon: Jannette Fogo, DDS;  Location: [facility];  Service: Oral Surgery;  Laterality: N/A;  . WISDOM TOOTH EXTRACTION      OB History   None      Home Medications    Prior to Admission medications   Medication Sig Start Date End Date Taking? Authorizing Provider  amoxicillin-clavulanate (AUGMENTIN) 400-57 MG/5ML suspension Take 10.9 mLs (872 mg total) by mouth 2 (two) times daily for 7 days. 01/31/18 02/07/18  Ok Edwards, PA-C  Cholecalciferol (VITAMIN D PO) Take 1 capsule by mouth daily.    [provider]  diclofenac (FLECTOR) 1.3 % PTCH Place 1 patch onto the skin 2 (two) times daily. 06/08/17   Trula Slade, DPM  fluconazole (DIFLUCAN) 40 MG/ML suspension  33mL 1 dose, can repeat in 72 hours if needed. 01/31/18   Tasia Catchings, Bronsen Serano V, PA-C  ibuprofen (ADVIL,MOTRIN) 800 MG tablet Take 1 tablet (800 mg total) by mouth every 6 (six) hours as needed. 10/20/15   Morris, Jinny Blossom, DO  MedroxyPROGESTERone Acetate (DEPO-PROVERA IM) Inject into the muscle.    [provider]  Montelukast Sodium (SINGULAIR PO) Take 10 mg by mouth at bedtime. THIS IS A SOLUTION- PT HAS DIFFICULTY SWALLOWING PILLS    [provider]  Olopatadine HCl (PATADAY) 0.2 % SOLN Apply 1  drop to eye 2 (two) times daily.    [provider]  oxybutynin (DITROPAN) 5 MG/5ML syrup Take 5 mg by mouth daily.     [provider]  polyethylene glycol powder (GLYCOLAX/MIRALAX) powder Take 17 g by mouth daily.    [provider]  sulfacetamide (BLEPH-10) 10 % ophthalmic solution Place 1-2 drops into the left eye every 4 (four) hours for 7 days. 01/31/18 02/07/18  Ok Edwards, PA-C    Family History Family History  Problem Relation Age of Onset  . Hyperthyroidism Father   . Hyperthyroidism Maternal Aunt   . Diabetes Maternal Grandmother   . Hypertension Maternal Grandmother     Social History Social History   Tobacco Use  . Smoking status: Never Smoker  . Smokeless tobacco: Never Used  Substance Use Topics  . Alcohol use: No  . Drug use: No     Allergies   Banana and Milk-related compounds   Review of Systems Review of Systems  Reason unable to perform ROS: See HPI as above.     Physical Exam Triage Vital Signs ED Triage Vitals [01/31/18 2008]  Enc Vitals Group     BP 129/87     Pulse Rate 86     Resp 18     Temp 98 F (36.7 C)     Temp src      SpO2 100 %     Weight      Height      Head Circumference      Peak Flow      Pain Score      Pain Loc      Pain Edu?      Excl. in [place]?    No data found.  Updated Vital Signs BP 129/87   Pulse 86   Temp 98 F (36.7 C)   Resp 18   SpO2 100%   Physical Exam  Constitutional: She is oriented to person, place, and time. She appears well-developed and well-nourished. No distress.  HENT:  Head: Normocephalic and atraumatic.  Right Ear: Tympanic membrane, external ear and ear canal normal. Tympanic membrane is not erythematous and not bulging.  Left Ear: Tympanic membrane, external ear and ear canal normal. Tympanic membrane is not erythematous and not bulging.  Nose: Mucosal edema and rhinorrhea present. Right sinus exhibits frontal sinus tenderness. Right sinus exhibits no maxillary  sinus tenderness. Left sinus exhibits frontal sinus tenderness. Left sinus exhibits no maxillary sinus tenderness.  Mouth/Throat: Uvula is midline, oropharynx is clear and moist and mucous membranes are normal.  Eyes: Pupils are equal, round, and reactive to light. EOM and lids are normal. Left conjunctiva is injected.  No ciliary flush.  Neck: Normal range of motion. Neck supple.  Cardiovascular: Normal rate, regular rhythm and normal heart sounds. Exam reveals no gallop and no friction rub.  No murmur heard. Pulmonary/Chest: Effort normal and breath sounds normal. She has no decreased breath  sounds. She has no wheezes. She has no rhonchi. She has no rales.  Lymphadenopathy:    She has no cervical adenopathy.  Neurological: She is alert and oriented to person, place, and time.  Skin: Skin is warm and dry.  Psychiatric: She has a normal mood and affect. Her behavior is normal. Judgment normal.     UC Treatments / Results  Labs (all labs ordered are listed, but only abnormal results are displayed) Labs Reviewed - No data to display  EKG None  Radiology No results found.  Procedures Procedures (including critical care time)  Medications Ordered in UC Medications - No data to display  Initial Impression / Assessment and Plan / UC Course  I have reviewed the triage vital signs and the nursing notes.  Pertinent labs & imaging results that were available during my care of the patient were reviewed by me and considered in my medical decision making (see chart for details).    Will treat for sinusitis with Augmentin. Other symptomatic treatment discussed. Start sulfacetamide drops as directed. Artificial tears gel as directed. Lid scrubs and warm compresses as directed. Patient to follow up with ophthalmology if symptoms worsens or does not improve. Return precautions given. Mother expresses understanding and agrees to plan.  Final Clinical Impressions(s) / UC Diagnoses   Final  diagnoses:  Acute non-recurrent frontal sinusitis    ED Prescriptions    Medication Sig Dispense Auth. Provider   fluconazole (DIFLUCAN) 40 MG/ML suspension 28mL 1 dose, can repeat in 72 hours if needed. 8 mL Vanderbilt Ranieri V, PA-C   amoxicillin-clavulanate (AUGMENTIN) 400-57 MG/5ML suspension Take 10.9 mLs (872 mg total) by mouth 2 (two) times daily for 7 days. 160 mL Rogerick Baldwin V, PA-C   sulfacetamide (BLEPH-10) 10 % ophthalmic solution Place 1-2 drops into the left eye every 4 (four) hours for 7 days. 4.2 mL Oneita Kras 01/31/18 2119    Ok Edwards, PA-C 01/31/18 2124

## 2018-09-06 ENCOUNTER — Ambulatory Visit: Payer: Medicaid Other | Admitting: Podiatry

## 2018-09-20 ENCOUNTER — Ambulatory Visit (INDEPENDENT_AMBULATORY_CARE_PROVIDER_SITE_OTHER): Payer: Medicaid Other | Admitting: Podiatry

## 2018-09-20 ENCOUNTER — Encounter: Payer: Self-pay | Admitting: Podiatry

## 2018-09-20 DIAGNOSIS — M2142 Flat foot [pes planus] (acquired), left foot: Secondary | ICD-10-CM | POA: Diagnosis not present

## 2018-09-20 DIAGNOSIS — M2141 Flat foot [pes planus] (acquired), right foot: Secondary | ICD-10-CM

## 2018-09-20 DIAGNOSIS — R269 Unspecified abnormalities of gait and mobility: Secondary | ICD-10-CM

## 2018-09-20 DIAGNOSIS — Q669 Congenital deformity of feet, unspecified, unspecified foot: Secondary | ICD-10-CM

## 2018-09-20 MED ORDER — DICLOFENAC EPOLAMINE 1.3 % TD PTCH: 1.0000 | MEDICATED_PATCH | Freq: Two times a day (BID) | TRANSDERMAL | 5 refills | Status: DC

## 2018-09-20 NOTE — Progress Notes (Signed)
Subjective: 28 year old female presents the office for mom from yearly evaluation symptomatic flat feet.  She does report some continued tenderness on dorsal aspect of the foot she is asking for refill of Flector patches.  No recent injury or falls or any increase in swelling or redness.  Toenails are doing well denies any redness or drainage from the toenail sites.   Denies any systemic complaints such as fevers, chills, nausea, vomiting. No acute changes since last appointment, and no other complaints at this time.   Objective: AAO x3, NAD DP/PT pulses palpable bilaterally, CRT less than 3 seconds There is a decrease in medial arch height upon weightbearing. Ankle, subtalar, midtarsal range of motion intact. Equinus is present. There is no area pinpoint bony tenderness and there is no overlying edema, erythema, increase in warmth. No tenderness.  Doing well.  She had some minimal incurvation distally along bilateral hallux with there is no edema, erythema.  As a courtesy I debrided the nails without any complications or bleeding and there is no signs of infection noted. No pain with calf compression, swelling, warmth, erythema  Assessment: Flatfoot deformity bilateral   Plan: -All treatment options discussed with the patient including all alternatives, risks, complications.  -As a courtesy I debrided the nails without any complications or bleeding. -Prescription for new inserts were given to the patient. -Discussed shoe gear changes as well -Flector patches as needed for pain -Follow-up in one year or sooner if needed. Call any questions concerns meantime. -Patient encouraged to call the office with any questions, concerns, change in symptoms.   Celesta Gentile, DPM

## 2018-11-19 ENCOUNTER — Other Ambulatory Visit: Payer: Self-pay

## 2018-11-19 ENCOUNTER — Telehealth: Payer: Self-pay | Admitting: *Deleted

## 2018-11-19 ENCOUNTER — Ambulatory Visit: Payer: Medicaid Other | Admitting: Podiatry

## 2018-11-19 DIAGNOSIS — B351 Tinea unguium: Secondary | ICD-10-CM

## 2018-11-19 DIAGNOSIS — D492 Neoplasm of unspecified behavior of bone, soft tissue, and skin: Secondary | ICD-10-CM | POA: Diagnosis not present

## 2018-11-19 DIAGNOSIS — B353 Tinea pedis: Secondary | ICD-10-CM

## 2018-11-19 DIAGNOSIS — M79674 Pain in right toe(s): Secondary | ICD-10-CM

## 2018-11-19 MED ORDER — KETOCONAZOLE 2 % EX CREA: 1.0000 "application " | TOPICAL_CREAM | Freq: Every day | CUTANEOUS | 0 refills | Status: DC

## 2018-11-19 NOTE — Telephone Encounter (Signed)
OK to refill

## 2018-11-21 NOTE — Progress Notes (Signed)
Subjective: 28 year old female presents the office with her mom for concerns of possible ingrown toenail and pain to the right fifth toe.  Her mom did try to trim it herself but was painful and he could not do this.  Denies any swelling or redness or any drainage.  No recent injury.Denies any systemic complaints such as fevers, chills, nausea, vomiting. No acute changes since last appointment, and no other complaints at this time.   She is also asking for refill of Flector patches.  Her mom called after the appointment asked for this.  Objective: NAD DP/PT pulses palpable bilaterally, CRT less than 3 seconds Adductovarus present right fifth toe there is a hyperkeratotic lesion adjacent to the fifth digit toenail which is painful.  Upon debridement there is no underlying ulceration drainage or any signs of infection.  The nail itself is mildly hypertrophic, dystrophic.  No significant incurvation present except for on the lateral aspect it is mildly ingrown.  No signs of infection. Mild tinea pedis is present bilaterally to the 4th interspace. No drainage, pus, pustules.  No area of tenderness to bilaterally feet otherwise.  No open lesions or pre-ulcerative lesions.  No pain with calf compression, swelling, warmth, erythema  Assessment: Right fifth digit listers corn, mild ingrown toenail  Plan: -All treatment options discussed with the patient including all alternatives, risks, complications.  -I debrided the hyperkeratotic lesion with any complications or bleeding.  Offloading pads dispensed.  Also debride the right third toe Complications. -Ketoconazole prescribed.  -Can refill Flector patches.  She uses as needed given her chronic foot pain.  This does help quite a bit.  Continue orthotics as well and supportive shoes. -Patient encouraged to call the office with any questions, concerns, change in symptoms.   Trula Slade DPM

## 2018-11-21 NOTE — Telephone Encounter (Signed)
Note is complete. thanks

## 2018-11-22 ENCOUNTER — Telehealth: Payer: Self-pay | Admitting: Podiatry

## 2018-11-22 NOTE — Telephone Encounter (Signed)
Pt needs prior-authorization filled out for her daughter patch. [facility] has sent over the information and we have not sent it back. Please call patients mother

## 2018-11-22 NOTE — Telephone Encounter (Signed)
Faxed required form, clinicals and demographics for Flector Patch.

## 2019-06-10 ENCOUNTER — Telehealth: Payer: Self-pay | Admitting: *Deleted

## 2019-06-10 ENCOUNTER — Other Ambulatory Visit: Payer: Self-pay | Admitting: Podiatry

## 2019-06-10 NOTE — Telephone Encounter (Signed)
[facility] on [address] [place] sent in a request for Flector 1.3% patch for the patient and per Dr Jacqualyn Posey refilled the prescription and use as directed and from the last office visit was ok to refill as needed (March 24,2020) due to that patient has chronic feet pain and does help patient and I called and left a message for the patient's mom (Virgie) stating that it would be at the pharmacy and if any concerns or questions to call the [place] office at [phone]. Lattie Haw

## 2019-06-16 NOTE — Telephone Encounter (Signed)
Pts mother called stating that she went to the pharmacy to pick up the prescription and the pharmacy has not received it.  Pts mother calling to follow up.

## 2019-06-16 NOTE — Telephone Encounter (Signed)
Called the [facility] on [address] and refilled the flector patch 1.3 % today and spoke with the pharmacy due to the mom went by yesterday and the pharmacy did not have the ok to refill and called the patient's mom today and stated that it was just refilled. Lattie Haw

## 2019-07-10 ENCOUNTER — Ambulatory Visit (INDEPENDENT_AMBULATORY_CARE_PROVIDER_SITE_OTHER): Payer: Medicaid Other

## 2019-07-10 ENCOUNTER — Ambulatory Visit: Payer: Medicaid Other | Admitting: Podiatry

## 2019-07-10 ENCOUNTER — Other Ambulatory Visit: Payer: Self-pay

## 2019-07-10 DIAGNOSIS — M2142 Flat foot [pes planus] (acquired), left foot: Secondary | ICD-10-CM | POA: Diagnosis not present

## 2019-07-10 DIAGNOSIS — M21619 Bunion of unspecified foot: Secondary | ICD-10-CM

## 2019-07-10 DIAGNOSIS — M779 Enthesopathy, unspecified: Secondary | ICD-10-CM

## 2019-07-10 DIAGNOSIS — M2141 Flat foot [pes planus] (acquired), right foot: Secondary | ICD-10-CM | POA: Diagnosis not present

## 2019-07-16 NOTE — Progress Notes (Signed)
Subjective: 28 year old female presents today with her caregiver for concerns of pain to both of her feet.  She points along the bunion area where she gets majority of tenderness.  They deny any recent injury or trauma.  She has had some swelling on the left ankle and foot which is intermittent.  No recent areas of redness or warmth they have noticed.Denies any systemic complaints such as fevers, chills, nausea, vomiting. No acute changes since last appointment, and no other complaints at this time.   Objective: AAO x3, NAD DP/PT pulses palpable bilaterally, CRT less than 3 seconds There is tenderness along the bunion sites bilaterally.  Not able to pinpoint any specific area of tenderness otherwise.  There is very minimal edema present there is no erythema or warmth.  Ankle, subtalar range of motion intact.  Flatfoot present there is no pain with calf compression, swelling, warmth, erythema  Assessment: Capsulitis  Plan: -All treatment options discussed with the patient including all alternatives, risks, complications.  -X-rays obtained reviewed.  Crepitus present.  Evidence plain.  No evidence of acute fracture. -Difficult to evaluate however the majority of tenderness along the bunion site.  I dispensed bunion pads.  They can use the Flector patches over this area. -Patient encouraged to call the office with any questions, concerns, change in symptoms.   Trula Slade DPM

## 2019-08-28 ENCOUNTER — Other Ambulatory Visit: Payer: Self-pay | Admitting: *Deleted

## 2019-08-28 ENCOUNTER — Telehealth: Payer: Self-pay | Admitting: *Deleted

## 2019-08-28 DIAGNOSIS — M2142 Flat foot [pes planus] (acquired), left foot: Secondary | ICD-10-CM

## 2019-08-28 DIAGNOSIS — M2141 Flat foot [pes planus] (acquired), right foot: Secondary | ICD-10-CM

## 2019-08-28 MED ORDER — DICLOFENAC EPOLAMINE 1.3 % EX PTCH: 1.0000 | MEDICATED_PATCH | Freq: Two times a day (BID) | CUTANEOUS | Status: DC

## 2019-08-28 NOTE — Telephone Encounter (Signed)
[facility] on [address] sent over a second RX refill request for diclofenac epolamine 1.3 topical patch and was ok per Dr Jacqualyn Posey to refill and called the patient's mom. Patricia Santos

## 2019-09-02 ENCOUNTER — Other Ambulatory Visit: Payer: Self-pay | Admitting: *Deleted

## 2019-09-02 DIAGNOSIS — M2141 Flat foot [pes planus] (acquired), right foot: Secondary | ICD-10-CM

## 2019-09-02 DIAGNOSIS — M779 Enthesopathy, unspecified: Secondary | ICD-10-CM

## 2019-09-02 MED ORDER — DICLOFENAC EPOLAMINE 1.3 % EX PTCH: 1.0000 | MEDICATED_PATCH | Freq: Two times a day (BID) | CUTANEOUS | Status: DC

## 2019-09-04 ENCOUNTER — Telehealth: Payer: Self-pay | Admitting: *Deleted

## 2019-09-04 NOTE — Telephone Encounter (Signed)
Mom came by the office today and wanted a RX of the prescription for patient and per Dr Jacqualyn Posey was ok to write and I delivered to the mom. Lattie Haw

## 2019-09-11 ENCOUNTER — Other Ambulatory Visit: Payer: Self-pay

## 2019-09-11 ENCOUNTER — Ambulatory Visit: Payer: Medicaid Other | Admitting: Podiatry

## 2019-09-11 ENCOUNTER — Encounter: Payer: Self-pay | Admitting: Podiatry

## 2019-09-11 VITALS — Temp 97.5°F

## 2019-09-11 DIAGNOSIS — M2141 Flat foot [pes planus] (acquired), right foot: Secondary | ICD-10-CM | POA: Diagnosis not present

## 2019-09-11 DIAGNOSIS — M21619 Bunion of unspecified foot: Secondary | ICD-10-CM

## 2019-09-11 DIAGNOSIS — M2142 Flat foot [pes planus] (acquired), left foot: Secondary | ICD-10-CM | POA: Diagnosis not present

## 2019-09-11 DIAGNOSIS — M779 Enthesopathy, unspecified: Secondary | ICD-10-CM

## 2019-09-11 MED ORDER — DICLOFENAC EPOLAMINE 1.3 % EX PTCH: 1.0000 | MEDICATED_PATCH | Freq: Two times a day (BID) | CUTANEOUS | 1 refills | Status: DC

## 2019-09-16 ENCOUNTER — Telehealth: Payer: Self-pay | Admitting: *Deleted

## 2019-09-16 DIAGNOSIS — M2141 Flat foot [pes planus] (acquired), right foot: Secondary | ICD-10-CM

## 2019-09-16 DIAGNOSIS — R269 Unspecified abnormalities of gait and mobility: Secondary | ICD-10-CM

## 2019-09-16 NOTE — Telephone Encounter (Signed)
Pt's mtr, Virgie states pt was to have therapy for her feet.

## 2019-09-17 NOTE — Telephone Encounter (Signed)
Yes can you do PT for gait training and lower extremity mobility? Thanks.

## 2019-09-18 NOTE — Progress Notes (Signed)
Subjective: 29 year old female presents today with her mother for follow-up evaluation of foot pain.  Patient seems to have occasional discomfort.  She has some occasional swelling but no redness or warmth.  She has had her nails done but I do have them checked. Denies any systemic complaints such as fevers, chills, nausea, vomiting. No acute changes since last appointment, and no other complaints at this time.   Objective: AAO x3, NAD DP/PT pulses palpable bilaterally, CRT less than 3 seconds There is no significant tenderness along the bunion sites bilaterally like identified today there is no area of tenderness I can elicit.  Equinus is present and there is guarding with subtalar joint range of motion today.  No significant edema, erythema.  There is no pain with calf compression, erythema or warmth.  Assessment: Capsulitis, flatfoot  Plan: -All treatment options discussed with the patient including all alternatives, risks, complications.  -We will start physical therapy.  [facility]cription for [facility] was written.  Prescription for inserts was given.  Continue with supportive shoes.  Flector patches as needed.  Return in about 1 year (around 09/10/2020).  Trula Slade DPM

## 2019-09-18 NOTE — Telephone Encounter (Signed)
Faxed referral to [facility]. 

## 2019-09-19 ENCOUNTER — Ambulatory Visit: Payer: Medicaid Other | Admitting: Podiatry

## 2019-10-09 ENCOUNTER — Other Ambulatory Visit: Payer: Self-pay

## 2019-10-09 ENCOUNTER — Encounter: Payer: Self-pay | Admitting: Physical Therapy

## 2019-10-09 ENCOUNTER — Ambulatory Visit: Payer: Medicaid Other | Attending: Podiatry | Admitting: Physical Therapy

## 2019-10-09 DIAGNOSIS — M25572 Pain in left ankle and joints of left foot: Secondary | ICD-10-CM | POA: Diagnosis not present

## 2019-10-09 DIAGNOSIS — R293 Abnormal posture: Secondary | ICD-10-CM | POA: Diagnosis present

## 2019-10-09 DIAGNOSIS — R2689 Other abnormalities of gait and mobility: Secondary | ICD-10-CM | POA: Diagnosis present

## 2019-10-09 DIAGNOSIS — M25571 Pain in right ankle and joints of right foot: Secondary | ICD-10-CM

## 2019-10-10 NOTE — Therapy (Signed)
[place], [place], [address] Phone: [phone]   Fax:  [phone]  Physical Therapy Evaluation  Patient Details  Name: Patricia Santos MRN: [id number] Date of Birth: 01-18-1991 Referring Provider (PT): Dr. Celesta Gentile   Encounter Date: 10/09/2019  PT End of Session - 10/09/19 1339    Visit Number  1    Number of Visits  12    Date for PT Re-Evaluation  12/04/19    Authorization Type  MCD    Authorization - Visit Number  0    Authorization - Number of Visits  12    PT Start Time  [id number]    PT Stop Time  1455    PT Time Calculation (min)  53 min    Activity Tolerance  Patient tolerated treatment well    Behavior During Therapy  Impulsive;Restless       Past Medical History:  Diagnosis Date  . Arthritis    feet  . Autistic disorder   . Complication of anesthesia    need to tape eyelids closed during surgery; eyelids do not close completely since ptosis surgery  . Constipation   . Difficulty swallowing pills    mixes medication with food  . History of petit-mal seizures    no seizures since 2007  . Mental retardation   . Seasonal allergies   . Teeth grinding   . Urinary incontinence     Past Surgical History:  Procedure Laterality Date  . BELPHAROPTOSIS REPAIR Bilateral   . HYSTEROSCOPY WITH D & C N/A 10/26/2015   Procedure: DILATATION AND CURETTAGE /HYSTEROSCOPY;  Surgeon: Linda Hedges, DO;  Location: [facility];  Service: Gynecology;  Laterality: N/A;  . INTRAUTERINE DEVICE (IUD) INSERTION N/A 05/02/2013   Procedure: INTRAUTERINE DEVICE (IUD) INSERTION;  Surgeon: Lahoma Crocker, MD;  Location: [facility];  Service: Gynecology;  Laterality: N/A;  . IUD REMOVAL N/A 10/20/2015   Procedure: Attempted INTRAUTERINE DEVICE (IUD) REMOVAL with Hysteroscopy;  Surgeon: Linda Hedges, DO;  Location: [facility];  Service: Gynecology;  Laterality: N/A;  . IUD REMOVAL N/A 10/26/2015   Procedure: INTRAUTERINE  DEVICE (IUD) REMOVAL;  Surgeon: Linda Hedges, DO;  Location: [facility];  Service: Gynecology;  Laterality: N/A;  . LAPAROSCOPY N/A 10/26/2015   Procedure: LAPAROSCOPY DIAGNOSTIC with IUD removalwith C Arm;  Surgeon: Linda Hedges, DO;  Location: [facility];  Service: Gynecology;  Laterality: N/A;  . MULTIPLE TOOTH EXTRACTIONS  04/28/2009   #1, #17, #32  . MUSCLE BIOPSY    . TOENAIL EXCISION Bilateral    for ingrown toenails  . TOOTH EXTRACTION N/A 04/08/2014   Procedure: DENTAL RESTORATION/EXTRACTIONS;  Surgeon: Doroteo Glassman, DDS;  Location: [facility];  Service: Dentistry;  Laterality: N/A;  . TOOTH EXTRACTION N/A 07/25/2017   Procedure: EXTRACTION MOLARS NUMBERS 16 AND 31;  Surgeon: Jannette Fogo, DDS;  Location: [facility];  Service: Oral Surgery;  Laterality: N/A;  . WISDOM TOOTH EXTRACTION      There were no vitals filed for this visit.   Subjective Assessment - 10/09/19 1407    Subjective  Pt here for bilateral foot pain due to capsulitis, pes planus.  She is awaiting new orthotics.  She complains about her feet hurting frequently thorughout the day.  Mom reports she curls her toes up at times when wlaing.  She always wears her supportive shoes (New Balance and orthotics) in and out of the home. She is cognitively impaired a relies on mother and  caregivers for supervision and occasional min A with ADLs.    Patient is accompained by:  Family member    Pertinent History  arthritis in feet, autism, cognitive impairements    Limitations  Standing;Walking;Lifting;House hold activities    How long can you stand comfortably?  unable to assess subjectively. Pt walked in clinic about 500 feet in clinic and complained of pain .    How long can you walk comfortably?  Pt and caregiver walk 1.5 to 2 miles daily weather permitting    Diagnostic tests  XR showed bunions and arthritis per mother    Patient Stated Goals  Reduce discomfort (mother)    Currently in Pain?  Yes     Pain Score  --   pt unable to rate   Pain Location  Foot    Pain Orientation  Left;Right   L >R   Pain Descriptors / Indicators  --   unable   Pain Type  Chronic pain    Pain Onset  More than a month ago    Pain Frequency  Other (Comment)    Aggravating Factors   walking, standing    Pain Relieving Factors  rest? unable to verbalize    Effect of Pain on Daily Activities  limits her walking and comfort with ADLs    Multiple Pain Sites  No         [facility] PT Assessment - 10/10/19 0001      Assessment   Medical Diagnosis  Bilateral pes planus , abnormality of gait     Referring Provider (PT)  Dr. Celesta Gentile    Onset Date/Surgical Date  --   about a year    Next MD Visit  1 yr     Prior Therapy  Yes       Precautions   Precautions  None;Other (comment)    Precaution Comments  history of seizues     Required Braces or Orthoses  --   has been out of AFO for 8 yrs (had 2 )      Restrictions   Weight Bearing Restrictions  No      Balance Screen   Has the patient fallen in the past 6 months  No    Has the patient had a decrease in activity level because of a fear of falling?   Yes    Is the patient reluctant to leave their home because of a fear of falling?   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, sports for special needs football and playes softball       Cognition   Overall Cognitive Status  History of cognitive impairments - at baseline      Observation/Other Assessments   Focus on Therapeutic Outcomes (FOTO)   NT MCD      Observation/Other Assessments-Edema    Edema  Figure 8      Figure 8 Edema   Figure 8 - Right   19 inch     Figure 8 - Left   20 inch       Sensation   Light  Touch  Appears Intact       Functional Tests   Functional tests  Single leg stance      Single Leg Stance   Comments  less than 8 sec bilateral L more challenging than Rt LE       Posture/Postural Control   Posture/Postural Control  Postural limitations    Postural Limitations  Rounded Shoulders;Forward head;Increased thoracic kyphosis    Posture Comments  knees hyperextend about 20 deg passively , pes planus bilateral and hallux valgus      AROM   Right Knee Extension  20   hyperextension   Right Knee Flexion  110    Left Knee Extension  20   hyperextension    Left Knee Flexion  110    Right Ankle Dorsiflexion  8    Right Ankle Plantar Flexion  45    Left Ankle Dorsiflexion  5    Left Ankle Plantar Flexion  45      PROM   Overall PROM Comments  WFL       Strength   Overall Strength Comments  Trendelenburg in SLS    pt. unable to follow commands  MMT but appears [facility] throughou   Right Hip ABduction  3+/5   appears    Left Hip ABduction  3+/5   appears    Right/Left Ankle  --   appears 4/5 to 4+/5 throughout, does not follow commands      Palpation   Palpation comment  gross palpation did not elicit any pain response, L ankle puffy lateral calcaneus       Ambulation/Gait   Gait Pattern  Step-through pattern;Decreased arm swing - right;Decreased arm swing - left;Decreased step length - right;Decreased step length - left;Right genu recurvatum;Left genu recurvatum;Lateral trunk lean to right;Lateral trunk lean to left;Wide base of support    Ambulation Surface  Level;Indoor    Pre-Gait Activities  irregular step length and width due to lack of coordination     Gait Comments  Pt able to walk at increased speed, on heels without noticeable difficulty.  Reporte pain with toe walking, avoided when cued.       Functional Gait  Assessment   FGA comment:  TBD                 Objective measurements completed on examination: See above findings.              PT Education - 10/10/19  0605    Education Details  PT/POC, prognosis, orthotics    Person(s) Educated  Patient    Methods  Explanation;Demonstration;Handout;Verbal cues    Comprehension  Verbalized understanding;Need further instruction;Verbal cues required       PT Short Term Goals - 10/10/19 1027      PT SHORT TERM GOAL #1   Title  Patient will be complaint with HEP for LE stretching and strengthening with guidance of mom and/or caregiver    Baseline  given on evaluation    Time  3    Period  Weeks    Status  New    Target Date  10/30/19        PT Long Term Goals - 10/10/19 [id number]      PT LONG TERM GOAL #1   Title  Patient will be able to demo consistent and increased independence with HEP for LE mobility    Baseline  unknown to patient and caregivers , given on eval    Time  8  Period  Weeks    Status  New    Target Date  12/04/19      PT LONG TERM GOAL #2   Title  Pt will be able to perform single leg balance activities with improved stability for 30 sec min dynamic balance activities.    Baseline  unable to stand for 10 sec statically    Time  8    Period  Weeks    Status  New    Target Date  12/04/19      PT LONG TERM GOAL #3   Title  Pt will be able to walk for 1 mile with caregiver with less verbal complaint of pain, less limping.    Baseline  1-2 miles and complains throughout the walk, limps at times    Time  8    Period  Weeks    Status  New    Target Date  12/04/19             Plan - 10/10/19 1028    Clinical Impression Statement  Patient presents for mod complexity eval of bilateral foot pain due to pes planus and long standing chronic malalignment in LEs. Her strength and ROM appear to be fairly good but it is difficult to assess formally due to cognitive deficits. She was able to do some dynamic gait activities fairly well other than toe walking. She plans to have new orthotics made and that will likely be most beneficial.  She will need consistent provider for best  outcome.  Assessment of progress will be challenging due to inability to report pain, symptoms. May get her to be more functionally mobile based on mother and caregiver report.    Personal Factors and Comorbidities  Comorbidity 1;Comorbidity 2;Past/Current Experience;Time since onset of injury/illness/exacerbation    Comorbidities  mental disability and cognitive impairments, arthritis    Examination-Activity Limitations  Locomotion Level;Stairs;Stand    Examination-Participation Restrictions  Community Activity;Interpersonal Relationship    Stability/Clinical Decision Making  Evolving/Moderate complexity    Clinical Decision Making  Moderate    Rehab Potential  Fair    PT Frequency  1x / week    PT Duration  3 weeks   then 2 x 4-6 more weeks if progressing   PT Treatment/Interventions  ADLs/Self Care Home Management;Gait training;Therapeutic activities;Neuromuscular re-education;Orthotic Fit/Training;Manual techniques;Therapeutic exercise;Balance training;Patient/family education;Taping;Functional mobility training    PT Next Visit Plan  Nustep, gait, review stretches    PT Home Exercise Plan  calf stretching, SLS, heel raises, hip abduction , seated DF    Recommended Other Services  orthotics    Consulted and Agree with Plan of Care  Patient;Family member/caregiver    Family Member Consulted  mother       Patient will benefit from skilled therapeutic intervention in order to improve the following deficits and impairments:  Abnormal gait, Decreased balance, Decreased coordination, Decreased mobility, Difficulty walking, Increased edema, Postural dysfunction, Pain, Decreased cognition, Decreased strength, Increased fascial restricitons, Impaired flexibility  Visit Diagnosis: Pain in left ankle and joints of left foot  Pain in right ankle and joints of right foot  Other abnormalities of gait and mobility  Abnormal posture     Problem List Patient Active Problem List   Diagnosis  Date Noted  . Arthritis 07/10/2017  . Autism 07/10/2017  . Incontinence 07/10/2017  . Intellectual disability 07/10/2017  . Constipation 07/11/2011  . Hematuria 07/11/2011  . Increased frequency of urination 07/11/2011    Patricia Santos 10/10/2019, 10:47 AM  [facility] [address] [place], [place], [address] Phone: [phone]   Fax:  [phone]  Name: Patricia Santos MRN: [id number] Date of Birth: 1990/10/02   Raeford Razor, PT 10/10/19 10:48 AM Phone: [phone] Fax: [phone]

## 2019-10-10 NOTE — Patient Instructions (Signed)
From med bridge: 2 x per day, 30 sec stretch x 3 Strengthening 2 x 10 reps   Seated calf stretch  Seated toe raises Standing heel raise Single leg balance  Sidelying hip abduction

## 2019-10-15 ENCOUNTER — Encounter: Payer: Self-pay | Admitting: Physical Therapy

## 2019-10-15 ENCOUNTER — Other Ambulatory Visit: Payer: Self-pay

## 2019-10-15 ENCOUNTER — Ambulatory Visit: Payer: Medicaid Other | Admitting: Physical Therapy

## 2019-10-15 DIAGNOSIS — R2689 Other abnormalities of gait and mobility: Secondary | ICD-10-CM

## 2019-10-15 DIAGNOSIS — M25571 Pain in right ankle and joints of right foot: Secondary | ICD-10-CM

## 2019-10-15 DIAGNOSIS — M25572 Pain in left ankle and joints of left foot: Secondary | ICD-10-CM

## 2019-10-15 DIAGNOSIS — R293 Abnormal posture: Secondary | ICD-10-CM

## 2019-10-15 NOTE — Therapy (Signed)
[place], [place], [address] Phone: [phone]   Fax:  [phone]  Physical Therapy Treatment  Patient Details  Name: Patricia Santos MRN: [id number] Date of Birth: 17-Apr-1991 Referring Provider (PT): Dr. Celesta Gentile   Encounter Date: 10/15/2019  PT End of Session - 10/15/19 0848    Visit Number  2    Number of Visits  12    Date for PT Re-Evaluation  12/04/19    Authorization Type  MCD    Authorization Time Period  2/17- 3/9    Authorization - Visit Number  1    Authorization - Number of Visits  3   12 total   PT Start Time  0745    PT Stop Time  0832    PT Time Calculation (min)  47 min    Activity Tolerance  Patient tolerated treatment well    Behavior During Therapy  [facility] for tasks assessed/performed       Past Medical History:  Diagnosis Date  . Arthritis    feet  . Autistic disorder   . Complication of anesthesia    need to tape eyelids closed during surgery; eyelids do not close completely since ptosis surgery  . Constipation   . Difficulty swallowing pills    mixes medication with food  . History of petit-mal seizures    no seizures since 2007  . Mental retardation   . Seasonal allergies   . Teeth grinding   . Urinary incontinence     Past Surgical History:  Procedure Laterality Date  . BELPHAROPTOSIS REPAIR Bilateral   . HYSTEROSCOPY WITH D & C N/A 10/26/2015   Procedure: DILATATION AND CURETTAGE /HYSTEROSCOPY;  Surgeon: Linda Hedges, DO;  Location: [facility];  Service: Gynecology;  Laterality: N/A;  . INTRAUTERINE DEVICE (IUD) INSERTION N/A 05/02/2013   Procedure: INTRAUTERINE DEVICE (IUD) INSERTION;  Surgeon: Lahoma Crocker, MD;  Location: [facility];  Service: Gynecology;  Laterality: N/A;  . IUD REMOVAL N/A 10/20/2015   Procedure: Attempted INTRAUTERINE DEVICE (IUD) REMOVAL with Hysteroscopy;  Surgeon: Linda Hedges, DO;  Location: [facility];  Service: Gynecology;  Laterality:  N/A;  . IUD REMOVAL N/A 10/26/2015   Procedure: INTRAUTERINE DEVICE (IUD) REMOVAL;  Surgeon: Linda Hedges, DO;  Location: [facility];  Service: Gynecology;  Laterality: N/A;  . LAPAROSCOPY N/A 10/26/2015   Procedure: LAPAROSCOPY DIAGNOSTIC with IUD removalwith C Arm;  Surgeon: Linda Hedges, DO;  Location: [facility];  Service: Gynecology;  Laterality: N/A;  . MULTIPLE TOOTH EXTRACTIONS  04/28/2009   #1, #17, #32  . MUSCLE BIOPSY    . TOENAIL EXCISION Bilateral    for ingrown toenails  . TOOTH EXTRACTION N/A 04/08/2014   Procedure: DENTAL RESTORATION/EXTRACTIONS;  Surgeon: Doroteo Glassman, DDS;  Location: [facility];  Service: Dentistry;  Laterality: N/A;  . TOOTH EXTRACTION N/A 07/25/2017   Procedure: EXTRACTION MOLARS NUMBERS 16 AND 31;  Surgeon: Jannette Fogo, DDS;  Location: [facility];  Service: Oral Surgery;  Laterality: N/A;  . WISDOM TOOTH EXTRACTION      There were no vitals filed for this visit.  Subjective Assessment - 10/15/19 0846    Subjective  Mother reports no new complaints.  Has done HEP with mom.  Pt reports pain but points to smiley face on VAS pain scale.    Currently in Pain?  Other (Comment)   unable to objectively report      [facility] PT Treatment/Exercise - 10/15/19 0001  Manual Therapy   Manual Therapy  Passive ROM    Passive ROM  ankle bilateral, including toe extension and metatarsals      Ankle Exercises: Stretches   Plantar Fascia Stretch  3 reps;30 seconds    Plantar Fascia Stretch Limitations  seated with strap     Slant Board Stretch  3 reps;30 seconds    Slant Board Stretch Limitations  bilateral, unilateral, mod cues     Other Stretch  wall stretch as alternative x 4 , cues for placement of ankle       Ankle Exercises: Aerobic   Nustep  L4 Ue and LE for 6 min       Ankle Exercises: Standing   SLS  needs min A , leaning , done several times each side     Heel Raises  Both;20 reps    Balance Beam  tandem stance with  light min A       Ankle Exercises: Seated   Towel Crunch  Other (comment)    Towel Crunch Limitations  done in sitting, standing with mod to max cues     Toe Raise  10 reps      Ankle Exercises: Sidelying   Other Sidelying Ankle Exercises  hip abduction x 2 x 10 each side                PT Short Term Goals - 10/10/19 1027      PT SHORT TERM GOAL #1   Title  Patient will be complaint with HEP for LE stretching and strengthening with guidance of mom and/or caregiver    Baseline  given on evaluation    Time  3    Period  Weeks    Status  New    Target Date  10/30/19        PT Long Term Goals - 10/10/19 0608      PT LONG TERM GOAL #1   Title  Patient will be able to demo consistent and increased independence with HEP for LE mobility    Baseline  unknown to patient and caregivers , given on eval    Time  8    Period  Weeks    Status  New    Target Date  12/04/19      PT LONG TERM GOAL #2   Title  Pt will be able to perform single leg balance activities with improved stability for 30 sec min dynamic balance activities.    Baseline  unable to stand for 10 sec statically    Time  8    Period  Weeks    Status  New    Target Date  12/04/19      PT LONG TERM GOAL #3   Title  Pt will be able to walk for 1 mile with caregiver with less verbal complaint of pain, less limping.    Baseline  1-2 miles and complains throughout the walk, limps at times    Time  8    Period  Weeks    Status  New    Target Date  12/04/19            Plan - 10/15/19 0851    Clinical Impression Statement  Pt did well today.  Needed min cues and physical assist for guiding leg(s) with stretching.  She had difficulty performing with toe scrunches repetively, L toes curl and could not release smoothly.  SLS very challenging, trunk leans.  Same HEPbut add holding  on with 1 or both hands.    PT Treatment/Interventions  ADLs/Self Care Home Management;Gait training;Therapeutic  activities;Neuromuscular re-education;Orthotic Fit/Training;Manual techniques;Therapeutic exercise;Balance training;Patient/family education;Taping;Functional mobility training    PT Next Visit Plan  Nustep, gait, review stretches. Standing calf stretch (wall, step)    PT Home Exercise Plan  calf stretching, SLS, heel raises, hip abduction , seated DF    Consulted and Agree with Plan of Care  Patient;Family member/caregiver       Patient will benefit from skilled therapeutic intervention in order to improve the following deficits and impairments:  Abnormal gait, Decreased balance, Decreased coordination, Decreased mobility, Difficulty walking, Increased edema, Postural dysfunction, Pain, Decreased cognition, Decreased strength, Increased fascial restricitons, Impaired flexibility  Visit Diagnosis: Pain in left ankle and joints of left foot  Pain in right ankle and joints of right foot  Other abnormalities of gait and mobility  Abnormal posture     Problem List Patient Active Problem List   Diagnosis Date Noted  . Arthritis 07/10/2017  . Autism 07/10/2017  . Incontinence 07/10/2017  . Intellectual disability 07/10/2017  . Constipation 07/11/2011  . Hematuria 07/11/2011  . Increased frequency of urination 07/11/2011    Nemiah Kissner 10/15/2019, 9:06 AM  [place], [place], [address] Phone: [phone]   Fax:  [phone]  Name: Patricia Santos MRN: [id number] Date of Birth: Aug 13, 1991  Raeford Razor, PT 10/15/19 9:06 AM Phone: [phone] Fax: [phone]

## 2019-10-23 ENCOUNTER — Encounter: Payer: Self-pay | Admitting: Physical Therapy

## 2019-10-23 ENCOUNTER — Other Ambulatory Visit: Payer: Self-pay

## 2019-10-23 ENCOUNTER — Ambulatory Visit: Payer: Medicaid Other | Attending: Podiatry | Admitting: Physical Therapy

## 2019-10-23 DIAGNOSIS — M25571 Pain in right ankle and joints of right foot: Secondary | ICD-10-CM | POA: Diagnosis present

## 2019-10-23 DIAGNOSIS — M25572 Pain in left ankle and joints of left foot: Secondary | ICD-10-CM | POA: Diagnosis not present

## 2019-10-23 DIAGNOSIS — R2689 Other abnormalities of gait and mobility: Secondary | ICD-10-CM | POA: Diagnosis present

## 2019-10-23 DIAGNOSIS — R293 Abnormal posture: Secondary | ICD-10-CM | POA: Diagnosis present

## 2019-10-23 NOTE — Patient Instructions (Signed)
Access Code: [email or web address]  URL: [email or web address]  Date: 10/23/2019  Prepared by: Raeford Razor   Exercises  Ankle and Toe Plantarflexion with Resistance - 10 reps - 2 sets - 5 hold - 1x daily - 7x weekly  Ankle Eversion with Resistance - 10 reps - 2 sets - 5 hold - 1x daily - 7x weekly  Ankle Dorsiflexion with Resistance - 10 reps - 2 sets - 5 hold - 1x daily - 7x weekly  Ankle Inversion with Resistance - 10 reps - 2 sets - 5 hold - 1x daily - 7x weekly

## 2019-10-23 NOTE — Therapy (Signed)
[place], [place], [address] Phone: [phone]   Fax:  [phone]  Physical Therapy Treatment  Patient Details  Name: Patricia Santos MRN: [id number] Date of Birth: 1991/01/26 Referring Provider (PT): Dr. Celesta Gentile   Encounter Date: 10/23/2019  PT End of Session - 10/23/19 1502    Visit Number  3    Number of Visits  12    Date for PT Re-Evaluation  12/04/19    Authorization Type  MCD    Authorization Time Period  2/17- 3/9    Authorization - Visit Number  2    Authorization - Number of Visits  3    PT Start Time  [id number]    PT Stop Time  1530    PT Time Calculation (min)  41 min    Activity Tolerance  Patient tolerated treatment well    Behavior During Therapy  [facility] for tasks assessed/performed       Past Medical History:  Diagnosis Date  . Arthritis    feet  . Autistic disorder   . Complication of anesthesia    need to tape eyelids closed during surgery; eyelids do not close completely since ptosis surgery  . Constipation   . Difficulty swallowing pills    mixes medication with food  . History of petit-mal seizures    no seizures since 2007  . Mental retardation   . Seasonal allergies   . Teeth grinding   . Urinary incontinence     Past Surgical History:  Procedure Laterality Date  . BELPHAROPTOSIS REPAIR Bilateral   . HYSTEROSCOPY WITH D & C N/A 10/26/2015   Procedure: DILATATION AND CURETTAGE /HYSTEROSCOPY;  Surgeon: Linda Hedges, DO;  Location: [facility];  Service: Gynecology;  Laterality: N/A;  . INTRAUTERINE DEVICE (IUD) INSERTION N/A 05/02/2013   Procedure: INTRAUTERINE DEVICE (IUD) INSERTION;  Surgeon: Lahoma Crocker, MD;  Location: [facility];  Service: Gynecology;  Laterality: N/A;  . IUD REMOVAL N/A 10/20/2015   Procedure: Attempted INTRAUTERINE DEVICE (IUD) REMOVAL with Hysteroscopy;  Surgeon: Linda Hedges, DO;  Location: [facility];  Service: Gynecology;  Laterality: N/A;  . IUD  REMOVAL N/A 10/26/2015   Procedure: INTRAUTERINE DEVICE (IUD) REMOVAL;  Surgeon: Linda Hedges, DO;  Location: [facility];  Service: Gynecology;  Laterality: N/A;  . LAPAROSCOPY N/A 10/26/2015   Procedure: LAPAROSCOPY DIAGNOSTIC with IUD removalwith C Arm;  Surgeon: Linda Hedges, DO;  Location: [facility];  Service: Gynecology;  Laterality: N/A;  . MULTIPLE TOOTH EXTRACTIONS  04/28/2009   #1, #17, #32  . MUSCLE BIOPSY    . TOENAIL EXCISION Bilateral    for ingrown toenails  . TOOTH EXTRACTION N/A 04/08/2014   Procedure: DENTAL RESTORATION/EXTRACTIONS;  Surgeon: Doroteo Glassman, DDS;  Location: [facility];  Service: Dentistry;  Laterality: N/A;  . TOOTH EXTRACTION N/A 07/25/2017   Procedure: EXTRACTION MOLARS NUMBERS 16 AND 31;  Surgeon: Jannette Fogo, DDS;  Location: [facility];  Service: Oral Surgery;  Laterality: N/A;  . WISDOM TOOTH EXTRACTION      There were no vitals filed for this visit.  Subjective Assessment - 10/23/19 1455    Subjective  Mom says she is doing good with her exercises.    Currently in Pain?  No/denies   unable to rate but mentions without being asked        [facility] Adult PT Treatment/Exercise - 10/23/19 0001      Manual Therapy   Manual Therapy  Passive ROM  Manual therapy comments  gastroc stretching    Passive ROM  ankle bilateral, including toe extension and metatarsals      Ankle Exercises: Supine   T-Band  green x 20 all planes with PT assist     Other Supine Ankle Exercises  AROM for inversion and eversion x 10       Ankle Exercises: Stretches   Slant Board Stretch  3 reps;30 seconds    Other Stretch  wall stretch max cueing       Ankle Exercises: Aerobic   Nustep  L5 Ue and LE for 6 min       Ankle Exercises: Standing   Heel Raises  Both;20 reps    Balance Beam  tandem walking     Other Standing Ankle Exercises  step taps x 20 and step ups x 10                PT Short Term Goals - 10/23/19 1638      PT SHORT  TERM GOAL #1   Title  Patient will be complaint with HEP for LE stretching and strengthening with guidance of mom and/or caregiver    Baseline  mom reports she is    Status  Achieved        PT Long Term Goals - 10/23/19 1638      PT LONG TERM GOAL #1   Title  Patient will be able to demo consistent and increased independence with HEP for LE mobility    Status  On-going      PT LONG TERM GOAL #2   Title  Pt will be able to perform single leg balance activities with improved stability for 30 sec min dynamic balance activities.    Status  On-going      PT LONG TERM GOAL #3   Title  Pt will be able to walk for 1 mile with caregiver with less verbal complaint of pain, less limping.    Status  On-going            Plan - 10/23/19 1504    Clinical Impression Statement  Patient unable to walk in tandem, did well with other coordination activities. Introduced theraband for ankle strengthening. Pain patches applied to medial Big Toe. Did complain of pain with manual work but did not stop her from exercising and not localized.    PT Treatment/Interventions  ADLs/Self Care Home Management;Gait training;Therapeutic activities;Neuromuscular re-education;Orthotic Fit/Training;Manual techniques;Therapeutic exercise;Balance training;Patient/family education;Taping;Functional mobility training    PT Next Visit Plan  Nustep, gait, review stretches. Standing calf stretch (wall, step)    PT Home Exercise Plan  calf stretching, SLS, heel raises, hip abduction , seated DF    Consulted and Agree with Plan of Care  Patient;Family member/caregiver       Patient will benefit from skilled therapeutic intervention in order to improve the following deficits and impairments:  Abnormal gait, Decreased balance, Decreased coordination, Decreased mobility, Difficulty walking, Increased edema, Postural dysfunction, Pain, Decreased cognition, Decreased strength, Increased fascial restricitons, Impaired  flexibility  Visit Diagnosis: Pain in left ankle and joints of left foot  Pain in right ankle and joints of right foot  Other abnormalities of gait and mobility  Abnormal posture     Problem List Patient Active Problem List   Diagnosis Date Noted  . Arthritis 07/10/2017  . Autism 07/10/2017  . Incontinence 07/10/2017  . Intellectual disability 07/10/2017  . Constipation 07/11/2011  . Hematuria 07/11/2011  . Increased frequency of urination 07/11/2011  Larico Dimock 10/23/2019, 4:50 PM  [facility] [address] [place], [place], [address] Phone: [phone]   Fax:  [phone]  Name: Patricia Santos MRN: [id number] Date of Birth: [birth year]  Raeford Razor, PT 10/23/19 4:50 PM Phone: [phone] Fax: [phone]

## 2019-10-28 ENCOUNTER — Ambulatory Visit: Payer: Medicaid Other | Attending: Podiatry | Admitting: Physical Therapy

## 2019-10-28 ENCOUNTER — Encounter: Payer: Self-pay | Admitting: Physical Therapy

## 2019-10-28 ENCOUNTER — Other Ambulatory Visit: Payer: Self-pay

## 2019-10-28 DIAGNOSIS — R293 Abnormal posture: Secondary | ICD-10-CM | POA: Insufficient documentation

## 2019-10-28 DIAGNOSIS — R2689 Other abnormalities of gait and mobility: Secondary | ICD-10-CM | POA: Diagnosis present

## 2019-10-28 DIAGNOSIS — M25571 Pain in right ankle and joints of right foot: Secondary | ICD-10-CM | POA: Insufficient documentation

## 2019-10-28 DIAGNOSIS — M25572 Pain in left ankle and joints of left foot: Secondary | ICD-10-CM

## 2019-10-28 NOTE — Therapy (Addendum)
[place], [place], [address] Phone: [phone]   Fax:  [phone]  Physical Therapy Treatment/Discharge   Patient Details  Name: Patricia Santos MRN: [id number] Date of Birth: 1991/08/10 Referring Provider (PT): Dr. Celesta Gentile   Encounter Date: 10/28/2019  PT End of Session - 10/28/19 1449    Visit Number  4    Number of Visits  12    Date for PT Re-Evaluation  12/04/19    Authorization Type  MCD    Authorization Time Period  2/17- 3/9    Authorization - Visit Number  3    Authorization - Number of Visits  3    PT Start Time  3154    PT Stop Time  1445    PT Time Calculation (min)  42 min    Activity Tolerance  Patient tolerated treatment well    Behavior During Therapy  [facility] for tasks assessed/performed       Past Medical History:  Diagnosis Date  . Arthritis    feet  . Autistic disorder   . Complication of anesthesia    need to tape eyelids closed during surgery; eyelids do not close completely since ptosis surgery  . Constipation   . Difficulty swallowing pills    mixes medication with food  . History of petit-mal seizures    no seizures since 2007  . Mental retardation   . Seasonal allergies   . Teeth grinding   . Urinary incontinence     Past Surgical History:  Procedure Laterality Date  . BELPHAROPTOSIS REPAIR Bilateral   . HYSTEROSCOPY WITH D & C N/A 10/26/2015   Procedure: DILATATION AND CURETTAGE /HYSTEROSCOPY;  Surgeon: Linda Hedges, DO;  Location: [facility];  Service: Gynecology;  Laterality: N/A;  . INTRAUTERINE DEVICE (IUD) INSERTION N/A 05/02/2013   Procedure: INTRAUTERINE DEVICE (IUD) INSERTION;  Surgeon: Lahoma Crocker, MD;  Location: [facility];  Service: Gynecology;  Laterality: N/A;  . IUD REMOVAL N/A 10/20/2015   Procedure: Attempted INTRAUTERINE DEVICE (IUD) REMOVAL with Hysteroscopy;  Surgeon: Linda Hedges, DO;  Location: [facility];  Service: Gynecology;  Laterality:  N/A;  . IUD REMOVAL N/A 10/26/2015   Procedure: INTRAUTERINE DEVICE (IUD) REMOVAL;  Surgeon: Linda Hedges, DO;  Location: [facility];  Service: Gynecology;  Laterality: N/A;  . LAPAROSCOPY N/A 10/26/2015   Procedure: LAPAROSCOPY DIAGNOSTIC with IUD removalwith C Arm;  Surgeon: Linda Hedges, DO;  Location: [facility];  Service: Gynecology;  Laterality: N/A;  . MULTIPLE TOOTH EXTRACTIONS  04/28/2009   #1, #17, #32  . MUSCLE BIOPSY    . TOENAIL EXCISION Bilateral    for ingrown toenails  . TOOTH EXTRACTION N/A 04/08/2014   Procedure: DENTAL RESTORATION/EXTRACTIONS;  Surgeon: Doroteo Glassman, DDS;  Location: [place];  Service: Dentistry;  Laterality: N/A;  . TOOTH EXTRACTION N/A 07/25/2017   Procedure: EXTRACTION MOLARS NUMBERS 16 AND 31;  Surgeon: Jannette Fogo, DDS;  Location: [place];  Service: Oral Surgery;  Laterality: N/A;  . WISDOM TOOTH EXTRACTION      There were no vitals filed for this visit.  Subjective Assessment - 10/28/19 1406    Subjective  Feet hurt when I do the exercises.  Says she does them anyway.  Feet swollen. 'hands swollen" but unable to see where    Currently in Pain?  Yes   unable to rate        [facility] Adult PT Treatment/Exercise - 10/28/19 0001  Manual Therapy   Manual therapy comments  toes spread apart and into extension no pain apparent with full range in all toes, incl Gr Toe     Passive ROM  ankle bilateral, including toe extension and metatarsals      Ankle Exercises: Standing   SLS  on floor, on foam pad, min A needed     Heel Raises  Both;20 reps    Balance Beam  tandem hold ball toss     Other Standing Ankle Exercises  single leg static and min dynamic,leans Rt hip on bars, needs max cues     Other Standing Ankle Exercises  squat onto toes x 15 , max cues       Ankle Exercises: Stretches   Plantar Fascia Stretch  3 reps;30 seconds    Plantar Fascia Stretch Limitations  towel , bilateral       Additional Ankle  Exercises DO NOT USE   Towel Crunch Limitations  done in standing, max cues  with mod to max cues       Ankle Exercises: Seated   Towel Crunch  Other (comment)    Toe Raise  20 reps      Ankle Exercises: Aerobic   Nustep  L5 Ue and LE for 6 min       Ankle Exercises: Supine   T-Band  red x 20 DF    Other Supine Ankle Exercises  AROM for inversion and eversion x 10              PT Education - 10/28/19 1448    Education Details  warm foot soak for easing pain, stiffness    Person(s) Educated  Patient;Parent(s)    Methods  Explanation    Comprehension  Verbalized understanding       PT Short Term Goals - 10/28/19 1619      PT SHORT TERM GOAL #1   Title  Patient will be complaint with HEP for LE stretching and strengthening with guidance of mom and/or caregiver    Baseline  mom reports she is    Status  Achieved        PT Long Term Goals - 10/28/19 1619      PT LONG TERM GOAL #1   Title  Patient will be able to demo consistent and increased independence with HEP for LE mobility    Baseline  in progress    Status  On-going      PT LONG TERM GOAL #2   Title  Pt will be able to perform single leg balance activities with improved stability for 30 sec min dynamic balance activities.    Baseline  unable to stand for 10 sec statically , Rt leg better than Lt , also depends on attention to task    Status  On-going      PT LONG TERM GOAL #3   Title  Pt will be able to walk for 1 mile with caregiver with less verbal complaint of pain, less limping.    Baseline  1-2 miles and complains throughout the walk, limps at times    Status  On-going            Plan - 10/28/19 1620    Clinical Impression Statement  Worked on standing, coordination and balance in peds gym.  MIld degree of difficulty concentrating, single leg balance is poor.  Unable to effectively flex and extend toes, cues are abstract and needed increased time.  Took toes through end range   flex, ext and  abduction, pt did not wince or report pain. pt with confirmed bunion (R) and capsulitis but cognitive deficits make it challenging to assess pain and specifics.  She will benefit from continued PT to work on gait, LE alignment and instrinsic foot strength. Asked her to try to soak feet for pain relief. She needs to show functional improvement to be seen beyond the next 4 weeks.    Personal Factors and Comorbidities  Comorbidity 1;Comorbidity 2;Past/Current Experience;Time since onset of injury/illness/exacerbation    Comorbidities  mental disability and cognitive impairments, arthritis    Examination-Activity Limitations  Locomotion Level;Stairs;Stand    Examination-Participation Restrictions  Community Activity;Interpersonal Relationship    Stability/Clinical Decision Making  Evolving/Moderate complexity    PT Frequency  2x / week    PT Duration  4 weeks    PT Treatment/Interventions  ADLs/Self Care Home Management;Gait training;Therapeutic activities;Neuromuscular re-education;Orthotic Fit/Training;Manual techniques;Therapeutic exercise;Balance training;Patient/family education;Taping;Functional mobility training    PT Next Visit Plan  Nustep, gait, review stretches. Standing calf stretch (wall, step), try to do towel crunches and toe "yoga"    PT Home Exercise Plan  calf stretching, SLS, heel raises, hip abduction , seated DF,    Recommended Other Services  new orthotics    Consulted and Agree with Plan of Care  Patient;Family member/caregiver    Family Member Consulted  mother       Patient will benefit from skilled therapeutic intervention in order to improve the following deficits and impairments:  Abnormal gait, Decreased balance, Decreased coordination, Decreased mobility, Difficulty walking, Increased edema, Postural dysfunction, Pain, Decreased cognition, Decreased strength, Increased fascial restricitons, Impaired flexibility  Visit Diagnosis: Pain in left ankle and joints of left  foot  Pain in right ankle and joints of right foot  Other abnormalities of gait and mobility  Abnormal posture     Problem List Patient Active Problem List   Diagnosis Date Noted  . Arthritis 07/10/2017  . Autism 07/10/2017  . Incontinence 07/10/2017  . Intellectual disability 07/10/2017  . Constipation 07/11/2011  . Hematuria 07/11/2011  . Increased frequency of urination 07/11/2011    Patricia Santos 10/28/2019, 4:40 PM  [place], [place], [address] Phone: [phone]   Fax:  [phone]  Name: Patricia Santos MRN: [id number] Date of Birth: [birth year]  Raeford Razor, PT 10/28/19 4:40 PM Phone: [phone] Fax: [phone]  PHYSICAL THERAPY DISCHARGE SUMMARY  Visits from Start of Care: 4  Current functional level related to goals / functional outcomes: See above    Remaining deficits: Unknown, patient with chronic foot issues   Education / Equipment: HEP, orthotics, gait Plan: Patient agrees to discharge.  Patient goals were not met. Patient is being discharged due to not returning since the last visit.  ?????    Raeford Razor, PT 12/30/19 8:18 AM Phone: [phone] Fax: [phone]

## 2019-11-13 ENCOUNTER — Ambulatory Visit: Payer: Medicaid Other | Attending: Internal Medicine

## 2019-11-13 ENCOUNTER — Ambulatory Visit: Payer: Medicaid Other | Admitting: Podiatry

## 2019-11-13 DIAGNOSIS — Z23 Encounter for immunization: Secondary | ICD-10-CM

## 2019-11-13 NOTE — Progress Notes (Signed)
   Covid-19 Vaccination Clinic  Name:  Patricia Santos    MRN: [id number] DOB: 1990/11/07  11/13/2019  Patricia Santos was observed post Covid-19 immunization for 15 minutes without incident. She was provided with Vaccine Information Sheet and instruction to access the V-Safe system.   Patricia Santos was instructed to call 911 with any severe reactions post vaccine: Marland Kitchen Difficulty breathing  . Swelling of face and throat  . A fast heartbeat  . A bad rash all over body  . Dizziness and weakness   Immunizations Administered    Name Date Dose VIS Date Route   Pfizer COVID-19 Vaccine 11/13/2019  2:34 PM 0.3 mL 08/08/2019 Intramuscular   Manufacturer: [place]   Lot: [id number]   [place]: [id number]

## 2019-12-08 ENCOUNTER — Ambulatory Visit: Payer: Medicaid Other | Attending: Internal Medicine

## 2019-12-08 DIAGNOSIS — Z23 Encounter for immunization: Secondary | ICD-10-CM

## 2019-12-08 NOTE — Progress Notes (Signed)
   Covid-19 Vaccination Clinic  Name:  Patricia Santos    MRN: [id number] DOB: Jan 02, 1991  12/08/2019  Patricia Santos was observed post Covid-19 immunization for 15 minutes without incident. She was provided with Vaccine Information Sheet and instruction to access the V-Safe system.   Patricia Santos was instructed to call 911 with any severe reactions post vaccine: Marland Kitchen Difficulty breathing  . Swelling of face and throat  . A fast heartbeat  . A bad rash all over body  . Dizziness and weakness   Immunizations Administered    Name Date Dose VIS Date Route   Pfizer COVID-19 Vaccine 12/08/2019  3:59 PM 0.3 mL 08/08/2019 Intramuscular   Manufacturer: [employer]   Lot: [id number]   [place]: [id number]

## 2020-01-05 ENCOUNTER — Telehealth: Payer: Self-pay | Admitting: Podiatry

## 2020-01-05 NOTE — Telephone Encounter (Signed)
Patient needs a refill on pain patch , sent to [facility] on [address].

## 2020-01-07 ENCOUNTER — Telehealth: Payer: Self-pay | Admitting: Podiatry

## 2020-01-07 DIAGNOSIS — M779 Enthesopathy, unspecified: Secondary | ICD-10-CM

## 2020-01-07 DIAGNOSIS — R269 Unspecified abnormalities of gait and mobility: Secondary | ICD-10-CM

## 2020-01-07 DIAGNOSIS — M21619 Bunion of unspecified foot: Secondary | ICD-10-CM

## 2020-01-07 DIAGNOSIS — M2142 Flat foot [pes planus] (acquired), left foot: Secondary | ICD-10-CM

## 2020-01-07 NOTE — Telephone Encounter (Signed)
Pts mother called stating she needs pre authorization for prescriptions. Please advise.

## 2020-01-08 ENCOUNTER — Telehealth: Payer: Self-pay | Admitting: *Deleted

## 2020-01-08 MED ORDER — DICLOFENAC EPOLAMINE 1.3 % EX PTCH: 1.0000 | MEDICATED_PATCH | Freq: Two times a day (BID) | CUTANEOUS | 11 refills | Status: DC

## 2020-01-08 MED ORDER — DICLOFENAC EPOLAMINE 1.3 % EX PTCH: 1.0000 | MEDICATED_PATCH | Freq: Two times a day (BID) | CUTANEOUS | Status: DC

## 2020-01-08 NOTE — Telephone Encounter (Signed)
This request handled in Telephone Call of 01/07/2020.

## 2020-01-08 NOTE — Telephone Encounter (Signed)
Completed [facility] Request for Prior Approval - Standard Drug Request Form for Flector patch 1.3% one patch twice day #60/month for 1 year, in Clinical Information - Medical History:  3.  Clinical contraindiction, co-morbidity, or unique patient circumstance as a contraindication to preferred drug(s). In Please provide clinical information: "Pt is mentally and physically handicapped, oral medications are difficult for pt to take. Pt has been on this medication for many years with good pain management." Faxed form with demographics and LOV, to [facility].

## 2020-01-08 NOTE — Addendum Note (Signed)
Addended by: Harriett Sine D on: 01/08/2020 10:36 AM   Modules accepted: Orders

## 2020-01-08 NOTE — Telephone Encounter (Signed)
Left message informing pt's mtr, Virgie I had sent the PA for the Flector Patch to [facility] and it may take 3-5 days for the pre-cert.

## 2020-01-08 NOTE — Telephone Encounter (Signed)
Patient's mother(Virgie) is stating that Medicaid is requesting  a prior aurthorization for the pain medication that was sent to pharmacy([facility],[phone]). Please call.

## 2020-01-12 ENCOUNTER — Other Ambulatory Visit: Payer: Self-pay | Admitting: Podiatry

## 2020-01-12 ENCOUNTER — Telehealth: Payer: Self-pay | Admitting: *Deleted

## 2020-01-12 MED ORDER — DICLOFENAC EPOLAMINE 1.3 % EX PTCH: 1.0000 | MEDICATED_PATCH | Freq: Two times a day (BID) | CUTANEOUS | 1 refills | Status: DC

## 2020-01-12 NOTE — Telephone Encounter (Signed)
Per Dr Jacqualyn Posey ok to refill the flector 1.3 % patch at [facility] [address]. Lattie Haw

## 2020-01-19 NOTE — Telephone Encounter (Signed)
Patricia Santos our triage RN sent over a Prior authorization for the flector patch. Patricia Santos

## 2020-02-05 ENCOUNTER — Encounter: Payer: Self-pay | Admitting: Podiatry

## 2020-03-17 ENCOUNTER — Telehealth: Payer: Self-pay | Admitting: Podiatry

## 2020-03-17 ENCOUNTER — Other Ambulatory Visit: Payer: Self-pay | Admitting: Podiatry

## 2020-03-17 MED ORDER — DICLOFENAC EPOLAMINE 1.3 % EX PTCH: 1.0000 | MEDICATED_PATCH | Freq: Two times a day (BID) | CUTANEOUS | 1 refills | Status: DC

## 2020-03-17 NOTE — Telephone Encounter (Signed)
I have sent it to the pharmacy 

## 2020-03-17 NOTE — Telephone Encounter (Signed)
Pt mother called in to get the diclofenac patch called in to [facility] @ [address]

## 2020-04-01 ENCOUNTER — Other Ambulatory Visit: Payer: Self-pay | Admitting: *Deleted

## 2020-06-17 ENCOUNTER — Ambulatory Visit (INDEPENDENT_AMBULATORY_CARE_PROVIDER_SITE_OTHER): Payer: Medicaid Other | Admitting: Podiatry

## 2020-06-17 ENCOUNTER — Other Ambulatory Visit: Payer: Self-pay

## 2020-06-17 DIAGNOSIS — L600 Ingrowing nail: Secondary | ICD-10-CM | POA: Diagnosis not present

## 2020-06-17 MED ORDER — CEPHALEXIN 250 MG/5ML PO SUSR: 500.0000 mg | Freq: Three times a day (TID) | ORAL | 0 refills | Status: DC

## 2020-06-17 NOTE — Patient Instructions (Signed)

## 2020-06-18 ENCOUNTER — Ambulatory Visit: Payer: Medicaid Other | Admitting: Podiatry

## 2020-06-22 NOTE — Progress Notes (Signed)
Subjective: 29 year old female presents the office with her mom for concerns of ingrown toenails of her right big toe, lateral aspect but does localize swelling and tenderness to this area denies any drainage or pus.  She said no recent treatment for this. Denies any systemic complaints such as fevers, chills, nausea, vomiting. No acute changes since last appointment, and no other complaints at this time.   Objective: NAD DP/PT pulses palpable bilaterally, CRT less than 3 seconds Incurvation present to lateral aspect of right hallux toenail localized edema and erythema.  Small no purulence is identified today.  There is no ascending cellulitis.  No other areas of discomfort identified today.  No pain with calf compression, swelling, warmth, erythema  Assessment: Ingrown toenail with paronychia right hallux lateral nail border  Plan: -All treatment options discussed with the patient including all alternatives, risks, complications.  -I did was able to debride the portion of the ingrown toenail right lateral aspect with a slant back procedure.  Shoes not been tolerated partial nail avulsion to #2 in the office.  Was able to express purulence from the area after debridement no further purulence was identified.  Recommend Epson salt soaks daily.  Prescribed liquid Keflex.  If symptoms continue without partial nail avulsion. -Patient encouraged to call the office with any questions, concerns, change in symptoms.   Trula Slade DPM

## 2020-07-01 ENCOUNTER — Ambulatory Visit: Payer: Medicaid Other | Admitting: Podiatry

## 2020-09-10 ENCOUNTER — Encounter: Payer: Self-pay | Admitting: Podiatry

## 2020-09-10 ENCOUNTER — Ambulatory Visit (INDEPENDENT_AMBULATORY_CARE_PROVIDER_SITE_OTHER): Payer: Medicaid Other | Admitting: Podiatry

## 2020-09-10 ENCOUNTER — Other Ambulatory Visit: Payer: Self-pay

## 2020-09-10 DIAGNOSIS — R269 Unspecified abnormalities of gait and mobility: Secondary | ICD-10-CM

## 2020-09-10 DIAGNOSIS — L600 Ingrowing nail: Secondary | ICD-10-CM | POA: Diagnosis not present

## 2020-09-10 DIAGNOSIS — M2142 Flat foot [pes planus] (acquired), left foot: Secondary | ICD-10-CM | POA: Diagnosis not present

## 2020-09-10 DIAGNOSIS — M2141 Flat foot [pes planus] (acquired), right foot: Secondary | ICD-10-CM

## 2020-09-10 MED ORDER — DICLOFENAC EPOLAMINE 1.3 % EX PTCH: 1.0000 | MEDICATED_PATCH | Freq: Two times a day (BID) | CUTANEOUS | 1 refills | Status: DC

## 2020-09-13 NOTE — Progress Notes (Signed)
Subjective: 30 year old female presents the office with her mom for yearly foot exam.  She has been doing well.  The ingrown toenail that was present to right big toe, lateral aspect has been doing well and not causing any issues.  Denies any drainage or pus.  She still uses the flutter patches to her feet.  She did get some over-the-counter inserts which have been helpful as well.  She denies any recent injury or trauma no other concerns today.  Objective: NAD DP/PT pulses palpable bilaterally, CRT less than 3 seconds At this time there is no area of discomfort to bilateral feet today.  Decreased medial arch height.  Nails are mildly elongated, dystrophic but there is no pain.  There are some dried blood present along the right lateral nail fold but there is no drainage or pus or pain.  No signs of infection otherwise.  No open lesions.  Assessment: Bilateral chronic foot pain, ingrown toenail Plan: -All treatment options discussed with the patient including all alternatives, risks, complications.  -She is doing better with the orthotics.  She is to continue with supportive shoes and the inserts.  Flexor practice as needed.  As a courtesy I debrided the nails x10 without any complications or bleeding.  Monitor the right hallux ingrown toenail.  Trula Slade DPM

## 2021-02-01 ENCOUNTER — Telehealth: Payer: Self-pay | Admitting: Podiatry

## 2021-02-01 NOTE — Telephone Encounter (Signed)
I completed this earlier today. Lattie Haw- can you check to see if it needs an authorization? Thanks.

## 2021-02-01 NOTE — Telephone Encounter (Signed)
Pt mom called and stated that the patients pharmacy sent over a refill request for  Diclofenac patch. Patients mom also stated that she will need an auth for the medication, so would the provider be able to call it in?

## 2021-02-07 ENCOUNTER — Other Ambulatory Visit: Payer: Self-pay | Admitting: Podiatry

## 2021-02-07 ENCOUNTER — Telehealth: Payer: Medicaid Other | Admitting: *Deleted

## 2021-02-07 MED ORDER — DICLOFENAC EPOLAMINE 1.3 % EX PTCH: 1.0000 | MEDICATED_PATCH | Freq: Two times a day (BID) | CUTANEOUS | 2 refills | Status: DC

## 2021-02-07 NOTE — Telephone Encounter (Signed)
Patient's mother Wray Kearns) is calling to request a refill on diclofenac patch/authorization is needed from pharmacy. Please advise.

## 2021-02-09 ENCOUNTER — Telehealth: Payer: Self-pay | Admitting: *Deleted

## 2021-02-09 NOTE — Telephone Encounter (Signed)
Called and spoke with Jana Half from [place] and got an approval for the [employer] and the authorization number is [id number] and the reference number is [id number] and is valid from 02-08-2021 to 02-08-2022 and called and spoke with the patient's mom and also I called [facility] on [address] [place] and spoke with the pharmacist. Lattie Haw

## 2021-09-12 ENCOUNTER — Ambulatory Visit (INDEPENDENT_AMBULATORY_CARE_PROVIDER_SITE_OTHER): Payer: Medicaid Other | Admitting: Podiatry

## 2021-09-12 ENCOUNTER — Other Ambulatory Visit: Payer: Self-pay

## 2021-09-12 DIAGNOSIS — L600 Ingrowing nail: Secondary | ICD-10-CM | POA: Diagnosis not present

## 2021-09-12 DIAGNOSIS — R269 Unspecified abnormalities of gait and mobility: Secondary | ICD-10-CM

## 2021-09-12 DIAGNOSIS — M2142 Flat foot [pes planus] (acquired), left foot: Secondary | ICD-10-CM | POA: Diagnosis not present

## 2021-09-12 DIAGNOSIS — M2141 Flat foot [pes planus] (acquired), right foot: Secondary | ICD-10-CM | POA: Diagnosis not present

## 2021-09-12 MED ORDER — CICLOPIROX 0.77 % EX GEL
1.0000 | Freq: Two times a day (BID) | CUTANEOUS | 2 refills | Status: AC
Start: 2021-09-12 — End: ?

## 2021-09-12 MED ORDER — DICLOFENAC EPOLAMINE 1.3 % EX PTCH: 1.0000 | MEDICATED_PATCH | Freq: Two times a day (BID) | CUTANEOUS | 2 refills | Status: DC

## 2021-09-14 NOTE — Progress Notes (Signed)
Subjective: 31 year old female presents the office with her mom for yearly foot exam.  She has been doing well.  No new concerns today.  She is continue the Flector patches which have been helpful.  Also asking for the nails be trimmed today as she does get ingrown's.  No open sores.  No other concerns.  Objective: NAD DP/PT pulses palpable bilaterally, CRT less than 3 seconds At this time there is no area of discomfort to bilateral feet today.  Decreased medial arch height.   Nails are mildly elongated, dystrophic but there is no pain.  Mild incurvation most of the hallux toenails.  No signs of infection today.  No open lesions.  Assessment: Bilateral chronic foot pain, ingrown toenail  Plan: -All treatment options discussed with the patient including all alternatives, risks, complications.  -Continue inserts, arch support while supportive shoe gear.  Continue Flector patches as needed.  As a courtesy debrided nails x10 without any complications or bleeding.  Return in about 1 year (around 09/12/2022).  Trula Slade DPM

## 2022-01-05 ENCOUNTER — Encounter (HOSPITAL_COMMUNITY): Payer: Self-pay | Admitting: Oral Surgery

## 2022-01-05 ENCOUNTER — Other Ambulatory Visit: Payer: Self-pay

## 2022-01-05 NOTE — Progress Notes (Addendum)
U spoke with Runell Gess, Deseree's mother. ?Ginnie has authsim, I did interview with Wray Kearns, who denies having any s/s of Covid in her household and denies any known exposure to Covid.  ? 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 Patient's PCP is Nicholes Rough, [facility]. ? ?Virgie reports that Kandis is very anxious, she needs folks to speak to her soft and gently.  ?I left a voice message for Arbie Cookey in volunteer service that mother will need to be with patient. ? ?I instructed   VIrgie to have Brion to take shower with antibacteria soap.  DO not shave. No nail polish, artificial or acrylic nails. Wear clean clothes, brush your teeth. ?Glasses, contact lens,dentures or partials may not be worn in the OR. If you need to wear them, please bring a case for glasses, do not wear contacts or bring a case, the hospital does not have contact cases, dentures or partials will have to be removed , make sure they are clean, we will provide a denture cup to put them in. You will need some one to drive you home and a responsible person over the age of 52 to stay with you for the first 24 hours after surgery.  ? ? ? ?

## 2022-01-06 ENCOUNTER — Encounter (HOSPITAL_COMMUNITY): Payer: Self-pay | Admitting: Oral Surgery

## 2022-01-06 ENCOUNTER — Ambulatory Visit (HOSPITAL_COMMUNITY)
Admission: RE | Admit: 2022-01-06 | Discharge: 2022-01-06 | Disposition: A | Discharge status: Home / Self Care | Payer: Medicaid Other | Attending: Oral Surgery | Admitting: Oral Surgery

## 2022-01-06 ENCOUNTER — Ambulatory Visit (HOSPITAL_COMMUNITY): Payer: Medicaid Other | Admitting: Anesthesiology

## 2022-01-06 ENCOUNTER — Ambulatory Visit (HOSPITAL_BASED_OUTPATIENT_CLINIC_OR_DEPARTMENT_OTHER): Payer: Medicaid Other | Admitting: Anesthesiology

## 2022-01-06 ENCOUNTER — Encounter (HOSPITAL_COMMUNITY)
Admission: RE | Disposition: A | Discharge status: Home / Self Care | Payer: Self-pay | Source: Home / Self Care | Attending: Oral Surgery

## 2022-01-06 DIAGNOSIS — K029 Dental caries, unspecified: Secondary | ICD-10-CM | POA: Insufficient documentation

## 2022-01-06 DIAGNOSIS — R569 Unspecified convulsions: Secondary | ICD-10-CM | POA: Insufficient documentation

## 2022-01-06 DIAGNOSIS — M1990 Unspecified osteoarthritis, unspecified site: Secondary | ICD-10-CM | POA: Diagnosis not present

## 2022-01-06 DIAGNOSIS — F79 Unspecified intellectual disabilities: Secondary | ICD-10-CM | POA: Insufficient documentation

## 2022-01-06 DIAGNOSIS — K0889 Other specified disorders of teeth and supporting structures: Secondary | ICD-10-CM | POA: Diagnosis not present

## 2022-01-06 DIAGNOSIS — F849 Pervasive developmental disorder, unspecified: Secondary | ICD-10-CM

## 2022-01-06 DIAGNOSIS — F840 Autistic disorder: Secondary | ICD-10-CM | POA: Insufficient documentation

## 2022-01-06 HISTORY — PX: TOOTH EXTRACTION: SHX859

## 2022-01-06 LAB — POCT PREGNANCY, URINE: Preg Test, Ur: NEGATIVE

## 2022-01-06 SURGERY — DENTAL RESTORATION/EXTRACTIONS
Anesthesia: General

## 2022-01-06 MED ORDER — LIDOCAINE 2% (20 MG/ML) 5 ML SYRINGE
INTRAMUSCULAR | Status: DC | PRN
  Administered 2022-01-06: 80 mg via INTRAVENOUS

## 2022-01-06 MED ORDER — LIDOCAINE 2% (20 MG/ML) 5 ML SYRINGE
INTRAMUSCULAR | Status: AC
  Filled 2022-01-06: qty 5

## 2022-01-06 MED ORDER — PROPOFOL 10 MG/ML IV BOLUS
INTRAVENOUS | Status: DC | PRN
  Administered 2022-01-06: 100 mg via INTRAVENOUS
  Administered 2022-01-06 (×2): 30 mg via INTRAVENOUS

## 2022-01-06 MED ORDER — MIDAZOLAM HCL 2 MG/2ML IJ SOLN
INTRAMUSCULAR | Status: AC
  Filled 2022-01-06: qty 2

## 2022-01-06 MED ORDER — ONDANSETRON HCL 4 MG/2ML IJ SOLN
INTRAMUSCULAR | Status: AC
  Filled 2022-01-06: qty 2

## 2022-01-06 MED ORDER — ONDANSETRON HCL 4 MG/2ML IJ SOLN: 4.0000 mg | Freq: Once | INTRAMUSCULAR | Status: DC | PRN

## 2022-01-06 MED ORDER — ORAL CARE MOUTH RINSE: 15.0000 mL | Freq: Once | OROMUCOSAL | Status: AC

## 2022-01-06 MED ORDER — CEFAZOLIN SODIUM-DEXTROSE 2-4 GM/100ML-% IV SOLN
2.0000 g | INTRAVENOUS | Status: AC
  Administered 2022-01-06: 2 g via INTRAVENOUS
  Filled 2022-01-06: qty 100

## 2022-01-06 MED ORDER — LACTATED RINGERS IV SOLN: INTRAVENOUS | Status: DC

## 2022-01-06 MED ORDER — LIDOCAINE-EPINEPHRINE 2 %-1:100000 IJ SOLN
INTRAMUSCULAR | Status: AC
  Filled 2022-01-06: qty 1

## 2022-01-06 MED ORDER — SUGAMMADEX SODIUM 200 MG/2ML IV SOLN
INTRAVENOUS | Status: DC | PRN
  Administered 2022-01-06: 200 mg via INTRAVENOUS

## 2022-01-06 MED ORDER — DEXAMETHASONE SODIUM PHOSPHATE 10 MG/ML IJ SOLN
INTRAMUSCULAR | Status: AC
  Filled 2022-01-06: qty 1

## 2022-01-06 MED ORDER — SODIUM CHLORIDE 0.9 % IR SOLN
Status: DC | PRN
  Administered 2022-01-06: 1000 mL

## 2022-01-06 MED ORDER — LIDOCAINE-EPINEPHRINE 2 %-1:100000 IJ SOLN
INTRAMUSCULAR | Status: DC | PRN
  Administered 2022-01-06: 5 mL

## 2022-01-06 MED ORDER — ROCURONIUM BROMIDE 10 MG/ML (PF) SYRINGE
PREFILLED_SYRINGE | INTRAVENOUS | Status: AC
  Filled 2022-01-06: qty 10

## 2022-01-06 MED ORDER — DEXAMETHASONE SODIUM PHOSPHATE 10 MG/ML IJ SOLN
INTRAMUSCULAR | Status: DC | PRN
  Administered 2022-01-06: 10 mg via INTRAVENOUS

## 2022-01-06 MED ORDER — ONDANSETRON HCL 4 MG/2ML IJ SOLN
INTRAMUSCULAR | Status: DC | PRN
  Administered 2022-01-06: 4 mg via INTRAVENOUS

## 2022-01-06 MED ORDER — MIDAZOLAM HCL 2 MG/2ML IJ SOLN
INTRAMUSCULAR | Status: DC | PRN
  Administered 2022-01-06: 2 mg via INTRAVENOUS

## 2022-01-06 MED ORDER — FENTANYL CITRATE (PF) 250 MCG/5ML IJ SOLN
INTRAMUSCULAR | Status: DC | PRN
  Administered 2022-01-06: 50 ug via INTRAVENOUS

## 2022-01-06 MED ORDER — 0.9 % SODIUM CHLORIDE (POUR BTL) OPTIME
TOPICAL | Status: DC | PRN
Start: 2022-01-06 — End: 2022-01-06
  Administered 2022-01-06: 1000 mL

## 2022-01-06 MED ORDER — FENTANYL CITRATE (PF) 250 MCG/5ML IJ SOLN
INTRAMUSCULAR | Status: AC
  Filled 2022-01-06: qty 5

## 2022-01-06 MED ORDER — ROCURONIUM BROMIDE 10 MG/ML (PF) SYRINGE
PREFILLED_SYRINGE | INTRAVENOUS | Status: DC | PRN
  Administered 2022-01-06: 50 mg via INTRAVENOUS

## 2022-01-06 MED ORDER — OXYCODONE-ACETAMINOPHEN 5-325 MG PO TABS: 1.0000 | ORAL_TABLET | Freq: Four times a day (QID) | ORAL | 0 refills | Status: AC | PRN

## 2022-01-06 MED ORDER — CHLORHEXIDINE GLUCONATE 0.12 % MT SOLN
15.0000 mL | Freq: Once | OROMUCOSAL | Status: AC
  Administered 2022-01-06: 15 mL via OROMUCOSAL
  Filled 2022-01-06: qty 15

## 2022-01-06 MED ORDER — PROPOFOL 10 MG/ML IV BOLUS
INTRAVENOUS | Status: AC
  Filled 2022-01-06: qty 20

## 2022-01-06 MED ORDER — FENTANYL CITRATE (PF) 100 MCG/2ML IJ SOLN: 25.0000 ug | INTRAMUSCULAR | Status: DC | PRN

## 2022-01-06 MED ORDER — AMOXICILLIN 500 MG PO CAPS: 500.0000 mg | ORAL_CAPSULE | Freq: Two times a day (BID) | ORAL | 0 refills | Status: AC

## 2022-01-06 SURGICAL SUPPLY — 39 items
BAG COUNTER SPONGE SURGICOUNT (BAG) IMPLANT
BAG SPNG CNTER NS LX DISP (BAG)
BLADE SURG 15 STRL LF DISP TIS (BLADE) ×2 IMPLANT
BLADE SURG 15 STRL SS (BLADE)
BUR CROSS CUT FISSURE 1.6 (BURR) ×2 IMPLANT
BUR EGG ELITE 4.0 (BURR) ×2 IMPLANT
CANISTER SUCT 3000ML PPV (MISCELLANEOUS) ×3 IMPLANT
COVER SURGICAL LIGHT HANDLE (MISCELLANEOUS) ×3 IMPLANT
DECANTER SPIKE VIAL GLASS SM (MISCELLANEOUS) ×3 IMPLANT
DRAPE U-SHAPE 76X120 STRL (DRAPES) ×2 IMPLANT
GAUZE PACKING FOLDED 2  STR (GAUZE/BANDAGES/DRESSINGS) ×2
GAUZE PACKING FOLDED 2 STR (GAUZE/BANDAGES/DRESSINGS) ×2 IMPLANT
GLOVE BIO SURGEON STRL SZ 6.5 (GLOVE) IMPLANT
GLOVE BIO SURGEON STRL SZ7 (GLOVE) IMPLANT
GLOVE BIO SURGEON STRL SZ8 (GLOVE) ×3 IMPLANT
GLOVE BIOGEL PI IND STRL 6.5 (GLOVE) IMPLANT
GLOVE BIOGEL PI IND STRL 7.0 (GLOVE) IMPLANT
GLOVE BIOGEL PI INDICATOR 6.5 (GLOVE)
GLOVE BIOGEL PI INDICATOR 7.0 (GLOVE)
GOWN STRL REUS W/ TWL LRG LVL3 (GOWN DISPOSABLE) ×2 IMPLANT
GOWN STRL REUS W/ TWL XL LVL3 (GOWN DISPOSABLE) ×2 IMPLANT
GOWN STRL REUS W/TWL LRG LVL3 (GOWN DISPOSABLE) ×2
GOWN STRL REUS W/TWL XL LVL3 (GOWN DISPOSABLE) ×2
IV NS 1000ML (IV SOLUTION) ×2
IV NS 1000ML BAXH (IV SOLUTION) ×2 IMPLANT
KIT BASIN OR (CUSTOM PROCEDURE TRAY) ×3 IMPLANT
KIT TURNOVER KIT B (KITS) ×3 IMPLANT
NDL HYPO 25GX1X1/2 BEV (NEEDLE) ×4 IMPLANT
NEEDLE HYPO 25GX1X1/2 BEV (NEEDLE) ×4 IMPLANT
NS IRRIG 1000ML POUR BTL (IV SOLUTION) ×3 IMPLANT
PAD ARMBOARD 7.5X6 YLW CONV (MISCELLANEOUS) ×3 IMPLANT
SLEEVE IRRIGATION ELITE 7 (MISCELLANEOUS) ×3 IMPLANT
SPONGE SURGIFOAM ABS GEL 12-7 (HEMOSTASIS) IMPLANT
SUT CHROMIC 3 0 PS 2 (SUTURE) ×2 IMPLANT
SYR BULB IRRIG 60ML STRL (SYRINGE) ×3 IMPLANT
SYR CONTROL 10ML LL (SYRINGE) ×3 IMPLANT
TRAY ENT MC OR (CUSTOM PROCEDURE TRAY) ×3 IMPLANT
TUBING IRRIGATION (MISCELLANEOUS) ×3 IMPLANT
YANKAUER SUCT BULB TIP NO VENT (SUCTIONS) ×3 IMPLANT

## 2022-01-06 NOTE — Anesthesia Preprocedure Evaluation (Signed)
Anesthesia Evaluation  ?Patient identified by MRN, date of birth, ID band ?Patient awake ? ? ? ?Reviewed: ?Allergy & Precautions, NPO status , Patient's Chart, lab work & pertinent test results ? ?Airway ?Mallampati: II ? ?TM Distance: >3 FB ?Neck ROM: Full ? ? ? Dental ? ?(+) Teeth Intact, Dental Advisory Given ?  ?Pulmonary ?neg pulmonary ROS,  ?  ?Pulmonary exam normal ?breath sounds clear to auscultation ? ? ? ? ? ? Cardiovascular ?negative cardio ROS ?Normal cardiovascular exam ?Rhythm:Regular Rate:Normal ? ? ?  ?Neuro/Psych ?PSYCHIATRIC DISORDERS negative neurological ROS ?   ? GI/Hepatic ?negative GI ROS, Neg liver ROS,   ?Endo/Other  ?negative endocrine ROSObesity ? ? Renal/GU ?negative Renal ROS Bladder dysfunction ? ? ? ?  ?Musculoskeletal ? ?(+) Arthritis ,  ? Abdominal ?  ?Peds ? ?(+) mental retardation Hematology ?negative hematology ROS ?(+)   ?Anesthesia Other Findings ?Day of surgery medications reviewed with the patient. ? Reproductive/Obstetrics ? ?  ? ? ? ? ? ? ? ? ? ? ? ? ? ?  ?  ? ? ? ? ? ? ? ? ?Anesthesia Physical ?Anesthesia Plan ? ?ASA: 3 ? ?Anesthesia Plan: General  ? ?Post-op Pain Management: Ofirmev IV (intra-op)*  ? ?Induction: Intravenous ? ?PONV Risk Score and Plan: 3 and Midazolam, Dexamethasone and Ondansetron ? ?Airway Management Planned: Nasal ETT ? ?Additional Equipment:  ? ?Intra-op Plan:  ? ?Post-operative Plan: Extubation in OR ? ?Informed Consent: I have reviewed the patients History and Physical, chart, labs and discussed the procedure including the risks, benefits and alternatives for the proposed anesthesia with the patient or authorized representative who has indicated his/her understanding and acceptance.  ? ? ? ?Dental advisory given ? ?Plan Discussed with: CRNA ? ?Anesthesia Plan Comments:   ? ? ? ? ? ? ?Anesthesia Quick Evaluation ? ?

## 2022-01-06 NOTE — Op Note (Signed)
NAME: Patricia Santos, Patricia L.#13 ?MEDICAL RECORD NO: [id number] ?ACCOUNT NO: [id number] ?DATE OF BIRTH: [birth year] ?FACILITY: [facility] ?LOCATION: [facility] ?PHYSICIAN: Gae Bon, DDS ? ?Operative Report  ? ?DATE OF PROCEDURE: 01/06/2022 ? ?PREOPERATIVE DIAGNOSES:  Autism, mental retardation, dental caries, nonrestorable tooth #13. ? ?POSTOPERATIVE DIAGNOSES:  Autism, mental retardation, dental caries, nonrestorable tooth #13. ? ?PROCEDURE:  Extraction tooth #13. ? ?DESCRIPTION OF PROCEDURE:  The patient was taken to the operating room and placed on the table in supine position.  General anesthesia was administered.  An oral endotracheal tube was placed and secured.  The patient was draped for surgery.  Timeout was  ?performed.  The posterior pharynx was suctioned and a throat pack was placed.  2% lidocaine 1:100,000 epinephrine was infiltrated buccally and palatally around tooth #13.  A 15 blade used to make an incision around the tooth.  The tissue was reflected.   ?The tooth was elevated, but could not be removed or grasped with dental forceps due to the severity of the decay. The Stryker handpiece with a fissure bur was then used to remove circumferential bone around the crown under irrigation and then the tooth  ?was grasped with the forceps and removed. The socket was curetted, irrigated and closed with 3-0 chromic.  The oral cavity was then irrigated and flushed.  The throat pack was removed.  The patient was left under care of anesthesia for extubation and  ?transport to recovery room with plans for discharge home through day surgery. ? ?ESTIMATED BLOOD LOSS:  Minimum. ? ?COMPLICATIONS:  None. ? ?SPECIMENS:  None. ? ? ?SHW ?D: 01/06/2022 11:29:38 am T: 01/06/2022 10:38:00 pm  ?JOB: [id number]/ [id number]  ?

## 2022-01-06 NOTE — Anesthesia Postprocedure Evaluation (Signed)
Anesthesia Post Note ? ?Patient: Patricia Santos ? ?Procedure(s) Performed: DENTAL RESTORATION/EXTRACTIONS ? ?  ? ?Patient location during evaluation: PACU ?Anesthesia Type: General ?Level of consciousness: awake and alert ?Pain management: pain level controlled ?Vital Signs Assessment: post-procedure vital signs reviewed and stable ?Respiratory status: spontaneous breathing, nonlabored ventilation, respiratory function stable and patient connected to nasal cannula oxygen ?Cardiovascular status: blood pressure returned to baseline and stable ?Postop Assessment: no apparent nausea or vomiting ?Anesthetic complications: no ? ? ?No notable events documented. ? ?Last Vitals:  ?Vitals:  ? 01/06/22 1155 01/06/22 1210  ?BP: 130/81 (!) 126/111  ?Pulse:  90  ?Resp: 20 20  ?Temp:  36.7 ?C  ?SpO2: 100% 97%  ?  ?Last Pain:  ?Vitals:  ? 01/06/22 1210  ?TempSrc:   ?PainSc: 0-No pain  ? ? ?  ?  ?  ?  ?  ?  ? ?Santa Lighter ? ? ? ? ?

## 2022-01-06 NOTE — Anesthesia Procedure Notes (Signed)
Procedure Name: Intubation ?Date/Time: 01/06/2022 11:10 AM ?Performed by: Michele Rockers, CRNA ?Pre-anesthesia Checklist: Patient identified, Patient being monitored, Timeout performed, Emergency Drugs available and Suction available ?Patient Re-evaluated:Patient Re-evaluated prior to induction ?Oxygen Delivery Method: Circle System Utilized ?Preoxygenation: Pre-oxygenation with 100% oxygen ?Induction Type: IV induction ?Ventilation: Mask ventilation without difficulty ?Laryngoscope Size: Mac and 3 ?Grade View: Grade I ?Tube type: Oral ?Tube size: 7.0 mm ?Number of attempts: 1 ?Airway Equipment and Method: Stylet ?Placement Confirmation: ETT inserted through vocal cords under direct vision, positive ETCO2 and breath sounds checked- equal and bilateral ?Secured at: 21 cm ?Tube secured with: Tape ?Dental Injury: Teeth and Oropharynx as per pre-operative assessment  ? ? ? ? ?

## 2022-01-06 NOTE — Op Note (Signed)
01/06/2022 ? ?11:26 AM ? ?PATIENT:  Patricia Santos  31 y.o. female ? ?PRE-OPERATIVE DIAGNOSIS:  AUTISM, MENTAL RETARDATION, DENTAL CARIES NON-RESTORABLE TOOTH # 13. ? ?POST-OPERATIVE DIAGNOSIS:  SAME ? ?PROCEDURE:  Procedure(s): ?EXTRACTION TOOTH #13 ? ?SURGEON:  Surgeon(s): ?Diona Browner, DMD ? ?ANESTHESIA:   local and general ? ?EBL:  minimal ? ?DRAINS: none  ? ?SPECIMEN:  No Specimen ? ?COUNTS:  YES ? ?PLAN OF CARE: Discharge to home after PACU ? ?PATIENT DISPOSITION:  PACU - hemodynamically stable. ?  ?PROCEDURE DETAILS: ?Dictation #[id number] ? ?Gae Bon, DMD ?01/06/2022 ?11:26 AM ? ? ? ? ? ? ? ? ? ? ? ? ? ? ? ?  ?

## 2022-01-06 NOTE — Transfer of Care (Signed)
Immediate Anesthesia Transfer of Care Note ? ?Patient: Patricia Santos ? ?Procedure(s) Performed: DENTAL RESTORATION/EXTRACTIONS ? ?Patient Location: PACU ? ?Anesthesia Type:General ? ?Level of Consciousness: drowsy, patient cooperative and responds to stimulation ? ?Airway & Oxygen Therapy: Patient Spontanous Breathing ? ?Post-op Assessment: Report given to RN, Post -op Vital signs reviewed and stable and Patient moving all extremities X 4 ? ?Post vital signs: Reviewed and stable ? ?Last Vitals:  ?Vitals Value Taken Time  ?BP    ?Temp    ?Pulse    ?Resp 16 01/06/22 1140  ?SpO2    ?Vitals shown include unvalidated device data. ? ?Last Pain:  ?Vitals:  ? 01/06/22 0838  ?TempSrc: Oral  ?   ? ?  ? ?Complications: No notable events documented. ?

## 2022-01-06 NOTE — H&P (Signed)
HISTORY AND PHYSICAL ? ?Patricia Santos is a 31 y.o. female patient referred by general dentist for extraction tooth #13. Patient unable to have procedure performed outpatient secondary to MR. ? ?No diagnosis found. ? ?Past Medical History:  ?Diagnosis Date  ? Arthritis   ? feet  ? Autistic disorder   ? Complication of anesthesia   ? need to tape eyelids closed during surgery; eyelids do not close completely since ptosis surgery  ? Constipation   ? Difficulty swallowing pills   ? mixes medication with food  ? History of petit-mal seizures   ? no seizures since 2007  ? Mental retardation   ? Seasonal allergies   ? Teeth grinding   ? Urinary incontinence   ? mom  has patient on a schedule  ? ? ?Current Facility-Administered Medications  ?Medication Dose Route Frequency Provider Last Rate Last Admin  ? ceFAZolin (ANCEF) IVPB 2g/100 mL premix  2 g Intravenous On Call to Maybell, DMD      ? chlorhexidine (PERIDEX) 0.12 % solution 15 mL  15 mL Mouth/Throat Once Santa Lighter, MD      ? Or  ? MEDLINE mouth rinse  15 mL Mouth Rinse Once Santa Lighter, MD      ? lactated ringers infusion   Intravenous Continuous Santa Lighter, MD      ? ?Allergies  ?Allergen Reactions  ? Banana Other (See Comments)  ?  GI UPSET  ? Milk-Related Compounds Other (See Comments)  ?  GI UPSET  ? ?Active Problems: ?  * No active hospital problems. * ? ?Vitals: Blood pressure (!) 156/96, pulse (!) 114, temperature 97.8 ?F (36.6 ?C), temperature source Oral, resp. rate 18, height '5\' 4"'$  (1.626 m), weight 83.9 kg, SpO2 100 %. ?Lab results: ?Results for orders placed or performed during the hospital encounter of 01/06/22 (from the past 24 hour(s))  ?Pregnancy, urine POC     Status: None  ? Collection Time: 01/06/22  8:40 AM  ?Result Value Ref Range  ? Preg Test, Ur NEGATIVE NEGATIVE  ? ?Radiology Results: No results found. ?General appearance: cooperative, no distress, and moderately obese ?Head: Normocephalic, without obvious  abnormality, atraumatic ?Eyes: negative ?Nose: Nares normal. Septum midline. Mucosa normal. No drainage or sinus tenderness. ?Throat: lips, mucosa, and tongue normal; teeth and gums normal and dental caries tooth #13 ?Neck: no adenopathy ? ?Assessment: 47 F with Autism, mental disability, seizure history with non-restorable tooth #13 secondary to dental caries.  ? ?Plan: Extract tooth #13. GA. Day surgery. ? ? ?Diona Browner ?01/06/2022   ?

## 2022-01-07 ENCOUNTER — Encounter (HOSPITAL_COMMUNITY): Payer: Self-pay | Admitting: Oral Surgery

## 2022-02-27 ENCOUNTER — Other Ambulatory Visit: Payer: Self-pay | Admitting: Podiatry

## 2022-03-06 ENCOUNTER — Telehealth: Payer: Self-pay | Admitting: Podiatry

## 2022-03-06 NOTE — Telephone Encounter (Signed)
Pts mom called and states that the pharmacy needs a prior auth for the medication (generic) diclofenac (FLECTOR) 1.3 % [facility]  [facility] [place], [place]  [address] [place] [address]  Phone:  [phone]    Please advise

## 2022-03-08 ENCOUNTER — Telehealth: Payer: Self-pay | Admitting: *Deleted

## 2022-03-08 ENCOUNTER — Other Ambulatory Visit: Payer: Self-pay | Admitting: Podiatry

## 2022-03-08 MED ORDER — DICLOFENAC EPOLAMINE 1.3 % EX PTCH: MEDICATED_PATCH | CUTANEOUS | 2 refills | Status: DC

## 2022-03-08 NOTE — Telephone Encounter (Signed)
Called pharmacy concerning medication(diclofenac- patch)and they said that the prescription is being rejected, not sure why. I tried to contact medicaid w/ no response.Spoke with mother and she said that pharmacy told her that a new prescription needs to be sent in since it has been so long. Please advise.

## 2022-03-13 ENCOUNTER — Telehealth: Payer: Self-pay | Admitting: *Deleted

## 2022-03-13 NOTE — Telephone Encounter (Signed)
Medication has been approved for 1 year Approval date: 03/13/22-03/08/23 Authorization # :[id number] Contact: [id number] Pharmacy/patient's mother has been contacted with this information 03/13/22

## 2022-09-18 ENCOUNTER — Ambulatory Visit (INDEPENDENT_AMBULATORY_CARE_PROVIDER_SITE_OTHER): Payer: Medicaid Other | Admitting: Podiatry

## 2022-09-18 DIAGNOSIS — M2141 Flat foot [pes planus] (acquired), right foot: Secondary | ICD-10-CM

## 2022-09-18 DIAGNOSIS — L600 Ingrowing nail: Secondary | ICD-10-CM | POA: Diagnosis not present

## 2022-09-18 DIAGNOSIS — M2142 Flat foot [pes planus] (acquired), left foot: Secondary | ICD-10-CM | POA: Diagnosis not present

## 2022-09-18 MED ORDER — DICLOFENAC EPOLAMINE 1.3 % EX PTCH: MEDICATED_PATCH | CUTANEOUS | 2 refills | Status: DC

## 2022-09-18 NOTE — Progress Notes (Signed)
Subjective: Chief Complaint  Patient presents with   Foot Problem    1 year follow up on bilateral feet, mom stated that patient complained of pain in the toes     32 year old female presents the office with her mom for yearly foot exam.  States that she is having pain to her feet and the patient is able to point along the big toes where she gets majority discomfort.  She denies any drainage or pus.  He said the nails trimmed today.  No recent injuries to report.  She still uses a flexor patches of they are helpful.    Objective: NAD DP/PT pulses palpable bilaterally, CRT less than 3 seconds Nails are mildly elongated, dystrophic but there is no pain.  Incurvation present of the hallux toenails right side worse than left and this is where she points which does majority discomfort.  Continues to ingrown toenails causing her foot pain now. Decreased medial arch upon weightbearing.  No other areas of discomfort. No open lesions. No pain with calf compression  Assessment: Bilateral chronic foot pain, ingrown toenail  Plan: -All treatment options discussed with the patient including all alternatives, risks, complications.  -As a courtesy debride the nails without any complications or bleeding.  I do think her symptoms are coming from the ingrown toenails.  I discussed the mom removal and she wants to have them removed.  Will plan to do this under sedation.  Discussed risks of the procedure including recurrence, infection, nerve issues as well as the nails become thick, discolored brittle/cracked.  Consent was signed today. -We ordered flexor patches  Trula Slade DPM

## 2022-09-18 NOTE — Patient Instructions (Signed)
Soak Instructions    THE DAY AFTER THE PROCEDURE  Place 1/4 cup of epsom salts in a quart of warm tap water.  Submerge your foot or feet with outer bandage intact for the initial soak; this will allow the bandage to become moist and wet for easy lift off.  Once you remove your bandage, continue to soak in the solution for 20 minutes.  This soak should be done twice a day.  Next, remove your foot or feet from solution, blot dry the affected area and cover.  You may use a band aid large enough to cover the area or use gauze and tape.  Apply other medications to the area as directed by the doctor such as polysporin neosporin.  IF YOUR SKIN BECOMES IRRITATED WHILE USING THESE INSTRUCTIONS, IT IS OKAY TO SWITCH TO  WHITE VINEGAR AND WATER. Or you may use antibacterial soap and water to keep the toe clean  Monitor for any signs/symptoms of infection. Call the office immediately if any occur or go directly to the emergency room. Call with any questions/concerns.  Pre-Operative Instructions  Congratulations, you have decided to take an important step to improving your quality of life.  You can be assured that the doctors of [facility] will be with you every step of the way.  Plan to be at the surgery center/hospital at least 1 (one) hour prior to your scheduled time unless otherwise directed by the surgical center/hospital staff.  You must have a responsible adult accompany you, remain during the surgery and drive you home.  Make sure you have directions to the surgical center/hospital and know how to get there on time. For hospital based surgery you will need to obtain a history and physical form from your family physician within 1 month prior to the date of surgery- we will give you a form for you primary physician.  We make every effort to accommodate the date you request for surgery.  There are however, times where surgery dates or times have to be moved.  We will contact you as soon as possible  if a change in schedule is required.   No Aspirin/Ibuprofen for one week before surgery.  If you are on aspirin, any non-steroidal anti-inflammatory medications (Mobic, Aleve, Ibuprofen) you should stop taking it 7 days prior to your surgery.  You make take Tylenol  For pain prior to surgery.  Medications- If you are taking daily heart and blood pressure medications, seizure, reflux, allergy, asthma, anxiety, pain or diabetes medications, make sure the surgery center/hospital is aware before the day of surgery so they may notify you which medications to take or avoid the day of surgery. No food or drink after midnight the night before surgery unless directed otherwise by surgical center/hospital staff. No alcoholic beverages 24 hours prior to surgery.  No smoking 24 hours prior to or 24 hours after surgery. Wear loose pants or shorts- loose enough to fit over bandages, boots, and casts. No slip on shoes, sneakers are best. Bring your boot with you to the surgery center/hospital.  Also bring crutches or a walker if your physician has prescribed it for you.  If you do not have this equipment, it will be provided for you after surgery. If you have not been contracted by the surgery center/hospital by the day before your surgery, call to confirm the date and time of your surgery. Leave-time from work may vary depending on the type of surgery you have.  Appropriate arrangements should  be made prior to surgery with your employer. Prescriptions will be provided immediately following surgery by your doctor.  Have these filled as soon as possible after surgery and take the medication as directed. Remove nail polish on the operative foot. Wash the night before surgery.  The night before surgery wash the foot and leg well with the antibacterial soap provided and water paying special attention to beneath the toenails and in between the toes.  Rinse thoroughly with water and dry well with a towel.  Perform this wash  unless told not to do so by your physician.  Enclosed: 1 Ice pack (please put in freezer the night before surgery)   1 Hibiclens skin cleaner   Pre-op Instructions  If you have any questions regarding the instructions, do not hesitate to call our office at any point during this process.   [place]: [address] [place], [place] [address] [place]: [address] [place], [place] [address] [phone]  Dr. Celesta Gentile, DPM

## 2022-10-11 ENCOUNTER — Other Ambulatory Visit: Payer: Self-pay | Admitting: Podiatry

## 2022-10-11 ENCOUNTER — Encounter: Payer: Self-pay | Admitting: Podiatry

## 2022-10-11 DIAGNOSIS — L600 Ingrowing nail: Secondary | ICD-10-CM

## 2022-10-11 MED ORDER — CEPHALEXIN 250 MG/5ML PO SUSR: 500.0000 mg | Freq: Three times a day (TID) | ORAL | 0 refills | Status: AC

## 2022-10-24 ENCOUNTER — Other Ambulatory Visit: Payer: Self-pay | Admitting: Podiatry

## 2022-10-24 ENCOUNTER — Ambulatory Visit (INDEPENDENT_AMBULATORY_CARE_PROVIDER_SITE_OTHER): Payer: Medicaid Other

## 2022-10-24 VITALS — BP 124/88 | HR 67

## 2022-10-24 DIAGNOSIS — L600 Ingrowing nail: Secondary | ICD-10-CM

## 2022-10-24 NOTE — Progress Notes (Signed)
Patient presents today for post op visit # 1 , patient of Dr. Jacqualyn Posey   POV #1 DOS 10/11/22 REMOVAL OF INGROWN TOENAILS MEDIAL/ LATERAL BORDERS BOTH BIG TOENAILS WITH PHENOL TO HELP PREVENT REGROWTH   Patient presents in surgical shoes. Denies any falls or injury to the foot. No signs of infection. No calf pain or shortness of breath. Bandages dry and intact. Incisions are intact.  Advised patient and mom that they can discontinue epsom salt soaks at this time. Bilateral borders appeared to be healing well. No concerns voiced by patient or mom.    BP: 124/88 P: 67        Toes redressed today with band aids and placed back in the surgical shoes.  Patient will follow up with Dr. Jacqualyn Posey as needed.

## 2023-05-09 ENCOUNTER — Other Ambulatory Visit: Payer: Self-pay | Admitting: Podiatry

## 2023-05-15 ENCOUNTER — Other Ambulatory Visit: Payer: Self-pay | Admitting: Podiatry

## 2023-05-15 MED ORDER — DICLOFENAC EPOLAMINE 1.3 % EX PTCH: MEDICATED_PATCH | CUTANEOUS | 2 refills | Status: DC

## 2023-09-15 ENCOUNTER — Other Ambulatory Visit: Payer: Self-pay | Admitting: Podiatry

## 2023-09-17 ENCOUNTER — Encounter: Payer: Self-pay | Admitting: Podiatry

## 2023-09-17 ENCOUNTER — Ambulatory Visit (INDEPENDENT_AMBULATORY_CARE_PROVIDER_SITE_OTHER): Payer: MEDICAID | Admitting: Podiatry

## 2023-09-17 DIAGNOSIS — M2141 Flat foot [pes planus] (acquired), right foot: Secondary | ICD-10-CM

## 2023-09-17 DIAGNOSIS — M2142 Flat foot [pes planus] (acquired), left foot: Secondary | ICD-10-CM

## 2023-09-17 DIAGNOSIS — L600 Ingrowing nail: Secondary | ICD-10-CM

## 2023-09-17 MED ORDER — DICLOFENAC EPOLAMINE 1.3 % EX PTCH: MEDICATED_PATCH | CUTANEOUS | 2 refills | Status: DC

## 2023-09-17 NOTE — Patient Instructions (Signed)
  Choose a moisturizer from  the list below:  For normal skin: Moisturize feet once daily; do not apply between toes A.  CeraVe Daily Moisturizing Lotion B.  Lubriderm Advanced Therapy Lotion or Lubriderm Intense Skin Repair Lotion C.  Aquaphor Intensive Repair Lotion D.  Gold Bond Ultimate Diabetic Foot Lotion E.  Eucerin Intensive Repair Moisturizing Lotion  For extremely dry, cracked feet: moisturize feet once daily; do not apply between toes A. CeraVe Healing Ointment B. Eucerin Aquaphor Repairing Ointment (may be labeled Aquaphor Healing Ointment) C. Vaseline Petroleum Healing Jelly   If you have problems reaching your feet: apply to feet once daily; do not apply between toes A.  Eucerin Aquaphor Ointment Body Spray  B.  Vaseline Intensive Care Spray Moisturizer (Unscented,  Cocoa Radiant Spray or Aloe Smooth Spray)

## 2023-09-17 NOTE — Progress Notes (Signed)
Subjective: Chief Complaint  Patient presents with   Routine Post Op    RM#29 RFC    33 year old female presents the office with her mom for yearly foot exam.  She does follow the fracture patches.  Occasionally she will describe pain to her feet or mom is not sure if is actually hurting.  No injuries with the report. She also needs to have the nails trimmed today.  No recent injuries to report.     Objective: NAD DP/PT pulses palpable bilaterally, CRT less than 3 seconds Nails are mildly elongated, dystrophic but there is no pain.  Incurvation present of the hallux toenails right side worse than left and this is where she points which does majority discomfort.   Hyperkeratotic lesion on the right fifth toe adjacent to the toenail without any underlying ulceration, drainage or signs of infection. Decreased medial arch upon weightbearing.  No other areas of discomfort.  Not able to appreciate any area pinpoint tenderness. No open lesions. No pain with calf compression  Assessment: Bilateral chronic foot pain, elongated nails, mild hyperkeratotic lesion  Plan: -All treatment options discussed with the patient including all alternatives, risks, complications.  -As a courtesy debride the nails without any complications or bleeding.  Ingrown toenail Senior doing okay at this time.  Continue to monitor.  With a hyperkeratotic lesion this occurs without any complications or bleeding.  Continue offloading. -She is wearing shoes and good arch supports.  Recommend continue with this. -Refilled flexor patches  Vivi Barrack DPM

## 2023-09-21 ENCOUNTER — Telehealth: Payer: Self-pay

## 2023-09-21 ENCOUNTER — Telehealth: Payer: Self-pay | Admitting: Podiatry

## 2023-09-21 NOTE — Telephone Encounter (Signed)
PA request received from [facility] for Diclofenac Epolamine 1.3% patches. PA submitted through covermymeds and waiting on response.

## 2023-09-21 NOTE — Telephone Encounter (Signed)
Patient Mother called in stating, Pharmacist is requesting Provider contact insurance company for Pre-Authorization.

## 2023-09-24 NOTE — Telephone Encounter (Signed)
Patient has tried and filed Voltaren gel and lidocaine. These medications were ineffective.

## 2023-09-25 NOTE — Telephone Encounter (Signed)
PA was approved from 09/24/2023 to 09/23/2024. All strengths of drug are approved.

## 2023-09-25 NOTE — Telephone Encounter (Signed)
PA was approved. I tried calling patient's mother,but no answer. I left detailed message informing of approval.

## 2023-11-01 ENCOUNTER — Telehealth: Payer: Self-pay

## 2023-11-01 NOTE — Telephone Encounter (Signed)
 PA request received from [facility] for diclofenac epolamine 1.3% patches. PA submitted through covermymeds.  Lucendia Herrlich  (Key: [id number]) Rx #: [id number]

## 2023-11-01 NOTE — Telephone Encounter (Addendum)
 Previously approved authorization still active. This PA not needed.

## 2023-11-06 ENCOUNTER — Telehealth: Payer: Self-pay

## 2023-11-06 NOTE — Telephone Encounter (Signed)
 Patient's mother called inquiring about PA for diclofenac patches. I informed her that the current PA was still active and to be used for this prescription as well. I also informed her that while trying to obtain PA that insurance company sent notice stating that if there are any problems with the pharmacy processing the claim that the pharmacy needs to contact [facility] pharmacy services. I spoke with [facility] pharmacy and informed them as well. I also faxed copy of notice to them.

## 2024-02-25 ENCOUNTER — Other Ambulatory Visit: Payer: Self-pay | Admitting: Podiatry

## 2024-03-06 ENCOUNTER — Ambulatory Visit: Payer: MEDICAID | Admitting: Podiatry

## 2024-03-06 ENCOUNTER — Encounter: Payer: Self-pay | Admitting: Podiatry

## 2024-03-06 ENCOUNTER — Ambulatory Visit (INDEPENDENT_AMBULATORY_CARE_PROVIDER_SITE_OTHER): Payer: MEDICAID

## 2024-03-06 VITALS — Ht 64.0 in | Wt 185.0 lb

## 2024-03-06 DIAGNOSIS — S9031XA Contusion of right foot, initial encounter: Secondary | ICD-10-CM

## 2024-03-06 DIAGNOSIS — R600 Localized edema: Secondary | ICD-10-CM

## 2024-03-06 NOTE — Progress Notes (Signed)
 Subjective:   Patient ID: Patricia Santos, female   DOB: 33 y.o.   MRN: [id number]   HPI Patient presents with caregiver with pain in her right foot with patient being relatively noncommunicative and difficult to understand as to where this is originating from   ROS      Objective:  Physical Exam  Neurovascular status found to be intact negative Toula' sign noted quite a bit of forefoot swelling right with discomfort but difficult to tell where it seems to emanate from with patient nonverbal     Assessment:  Appears to be some kind of trauma injury to the right forefoot with caregiver not sure as to what may have happened     Plan:  H&P x-ray taken reviewed and I went ahead today and I applied compression stocking to the right foot and ankle to try to help and placed on diclofenac  twice a day.  Patient to be seen back if symptoms persist  X-rays were negative for signs of fracture appears to be soft tissue injury

## 2024-03-07 ENCOUNTER — Telehealth: Payer: Self-pay | Admitting: Podiatry

## 2024-03-07 NOTE — Telephone Encounter (Signed)
 Mother called back. They are currently using the diclofenac  patches daily on her feet. She is requesting an oral suspension medication or a pill that they are able to crush up and put in applesauce.

## 2024-03-07 NOTE — Telephone Encounter (Signed)
 Patient's mother called and would like anti-inflammatory medication sent to the [facility] pharmacy on [address]

## 2024-03-13 ENCOUNTER — Other Ambulatory Visit: Payer: Self-pay | Admitting: Podiatry

## 2024-03-13 MED ORDER — MELOXICAM 7.5 MG/5ML PO SUSP: 7.5000 mg | Freq: Every day | ORAL | 0 refills | Status: DC | PRN

## 2024-03-26 ENCOUNTER — Other Ambulatory Visit: Payer: Self-pay | Admitting: Podiatry

## 2024-03-26 ENCOUNTER — Telehealth: Payer: Self-pay | Admitting: Podiatry

## 2024-03-26 MED ORDER — MELOXICAM 7.5 MG PO TABS: 7.5000 mg | ORAL_TABLET | Freq: Every day | ORAL | 1 refills | Status: DC | PRN

## 2024-03-26 NOTE — Telephone Encounter (Signed)
 Patient Mother called regarding patient medication, which was ordered by Dr. Magdalen, Patient is Dr. Jameson patient. Mom would like for patient to receive pills instead of liquid meloxicam  due to pharmacy stating there is a back order on liquid meloxicam .

## 2024-03-27 NOTE — Telephone Encounter (Signed)
 Left message for patient's mom regarding request.

## 2024-05-24 ENCOUNTER — Other Ambulatory Visit: Payer: Self-pay | Admitting: Podiatry

## 2024-06-30 ENCOUNTER — Other Ambulatory Visit: Payer: Self-pay | Admitting: Podiatry

## 2024-07-03 ENCOUNTER — Encounter: Payer: Self-pay | Admitting: Podiatry

## 2024-07-03 ENCOUNTER — Ambulatory Visit (INDEPENDENT_AMBULATORY_CARE_PROVIDER_SITE_OTHER): Payer: MEDICAID | Admitting: Podiatry

## 2024-07-03 VITALS — Ht 64.0 in | Wt 185.0 lb

## 2024-07-03 DIAGNOSIS — M79671 Pain in right foot: Secondary | ICD-10-CM | POA: Diagnosis not present

## 2024-07-03 DIAGNOSIS — M79672 Pain in left foot: Secondary | ICD-10-CM

## 2024-07-03 DIAGNOSIS — M2142 Flat foot [pes planus] (acquired), left foot: Secondary | ICD-10-CM

## 2024-07-03 DIAGNOSIS — R2681 Unsteadiness on feet: Secondary | ICD-10-CM

## 2024-07-03 DIAGNOSIS — M2141 Flat foot [pes planus] (acquired), right foot: Secondary | ICD-10-CM

## 2024-07-03 DIAGNOSIS — G8929 Other chronic pain: Secondary | ICD-10-CM

## 2024-07-03 NOTE — Progress Notes (Signed)
 Subjective: Chief Complaint  Patient presents with   Foot Pain    Pt is here due to bilateral foot pain and swelling, mom states that this issue is ongoing, she does have arthritis in the feet, taking meloxicam  and icing the foot, due to her being special needs mom is not sure if symptoms are correct.   33 year old female presents the office today above concerns.  She notes her she is been having issues with pain to her feet.  At times she seems to be limping and favoring her feet.  This been a chronic issue.  She has been using the anti-inflammatory patches which helps some.  Her mom is concerned about the way her foot is position which could be causing symptoms as well.  She been taking the meloxicam  but her mom is concerned about taking this on a regular basis given side effects.  Objective: AAO x3, NAD DP/PT pulses palpable bilaterally, CRT less than 3 seconds Decreased medial arch upon weightbearing.  Unable to patient any area of pinpoint tenderness on exam today there is no edema, erythema.  Appears the flexor, extensor tendons are intact.  There is no erythema or warmth.  MMT 5/5. 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  -Reviewed prior x-rays.  At this time I discussed with them either inserts or custom brace ([id number]).  She is touching modifications and inserts previously without improvement recommend wearing brace to help stabilize the foot, ankle to help facilitate gait and decrease pain.  Will submit authorization for this. -Recommend anti-inflammatory use as needed.  Discussed that she can alternate with Tylenol  if needed as well. -Referral to physical therapy. -Patient encouraged to call the office with any questions, concerns, change in symptoms.   Donnice JONELLE Fees DPM

## 2024-08-13 ENCOUNTER — Encounter (HOSPITAL_COMMUNITY): Payer: Self-pay

## 2024-08-13 ENCOUNTER — Ambulatory Visit (HOSPITAL_COMMUNITY): Payer: MEDICAID | Attending: Podiatry

## 2024-08-13 ENCOUNTER — Other Ambulatory Visit: Payer: Self-pay

## 2024-08-13 DIAGNOSIS — M79671 Pain in right foot: Secondary | ICD-10-CM | POA: Insufficient documentation

## 2024-08-13 DIAGNOSIS — Z7409 Other reduced mobility: Secondary | ICD-10-CM | POA: Insufficient documentation

## 2024-08-13 DIAGNOSIS — R2681 Unsteadiness on feet: Secondary | ICD-10-CM | POA: Diagnosis not present

## 2024-08-13 DIAGNOSIS — M79672 Pain in left foot: Secondary | ICD-10-CM | POA: Diagnosis present

## 2024-08-13 NOTE — Therapy (Signed)
 OUTPATIENT PHYSICAL THERAPY LOWER EXTREMITY EVALUATION   Patient Name: Patricia Santos MRN: [id number] DOB:Mar 31, 1991, 33 y.o., female Today's Date: 08/13/2024  END OF SESSION:  PT End of Session - 08/13/24 1609     Visit Number 1    Date for Recertification  09/24/24    Authorization Type [employer]    Authorization Time Period seeking auth    Progress Note Due on Visit 10    PT Start Time 0945    PT Stop Time 1030    PT Time Calculation (min) 45 min    Activity Tolerance Patient tolerated treatment well;Patient limited by pain    Behavior During Therapy WFL for tasks assessed/performed          Past Medical History:  Diagnosis Date   Arthritis    feet   Autistic disorder    Complication of anesthesia    need to tape eyelids closed during surgery; eyelids do not close completely since ptosis surgery   Constipation    Difficulty swallowing pills    mixes medication with food   History of petit-mal seizures    no seizures since 2007   Mental retardation    Seasonal allergies    Teeth grinding    Urinary incontinence    mom  has patient on a schedule   Past Surgical History:  Procedure Laterality Date   BELPHAROPTOSIS REPAIR Bilateral    HYSTEROSCOPY WITH D & C N/A 10/26/2015   Procedure: DILATATION AND CURETTAGE /HYSTEROSCOPY;  Surgeon: Duwaine Blumenthal, DO;  Location: [facility];  Service: Gynecology;  Laterality: N/A;   INTRAUTERINE DEVICE (IUD) INSERTION N/A 05/02/2013   Procedure: INTRAUTERINE DEVICE (IUD) INSERTION;  Surgeon: Olam Mill, MD;  Location: [facility];  Service: Gynecology;  Laterality: N/A;   IUD REMOVAL N/A 10/20/2015   Procedure: Attempted INTRAUTERINE DEVICE (IUD) REMOVAL with Hysteroscopy;  Surgeon: Duwaine Blumenthal, DO;  Location: [facility];  Service: Gynecology;  Laterality: N/A;   IUD REMOVAL N/A 10/26/2015   Procedure: INTRAUTERINE DEVICE (IUD) REMOVAL;  Surgeon: Duwaine Blumenthal, DO;  Location: [facility];  Service: Gynecology;  Laterality: N/A;    LAPAROSCOPY N/A 10/26/2015   Procedure: LAPAROSCOPY DIAGNOSTIC with IUD removalwith C Arm;  Surgeon: Duwaine Blumenthal, DO;  Location: [facility];  Service: Gynecology;  Laterality: N/A;   MULTIPLE TOOTH EXTRACTIONS  04/28/2009   #1, #17, #32   MUSCLE BIOPSY     TOENAIL EXCISION Bilateral    for ingrown toenails   TOOTH EXTRACTION N/A 04/08/2014   Procedure: DENTAL RESTORATION/EXTRACTIONS;  Surgeon: Glendia Angles, DDS;  Location: [facility];  Service: Dentistry;  Laterality: N/A;   TOOTH EXTRACTION N/A 07/25/2017   Procedure: EXTRACTION MOLARS NUMBERS 16 AND 31;  Surgeon: Cleotilde Rom, DDS;  Location: [facility];  Service: Oral Surgery;  Laterality: N/A;   TOOTH EXTRACTION N/A 01/06/2022   Procedure: DENTAL RESTORATION/EXTRACTIONS;  Surgeon: Sheryle Glendia, DMD;  Location: [facility];  Service: Oral Surgery;  Laterality: N/A;   WISDOM TOOTH EXTRACTION     Patient Active Problem List   Diagnosis Date Noted   Arthritis 07/10/2017   Autism 07/10/2017   Incontinence 07/10/2017   Intellectual disability 07/10/2017   Constipation 07/11/2011   Hematuria 07/11/2011   Increased frequency of urination 07/11/2011    PCP: Samie Frederick, PA-C   REFERRING PROVIDER: Gershon Donnice SAUNDERS, DPM  REFERRING DIAG: [phone] (ICD-10-CM) - Gait instability  THERAPY DIAG:  Bilateral foot pain  Impaired functional mobility, balance, gait, and endurance  Rationale  for Evaluation and Treatment: Rehabilitation  ONSET DATE: worsened last 6 months  SUBJECTIVE:   SUBJECTIVE STATEMENT: Eval: Pts mother states pt has flat feet and arthritis has made the feet hurt. Pts mother states she is autistic and has always struggled with walking. States she has noticed her feet swell some and turn in. Pts mother states she has purchased new balance shoes with inserts. States that podiatry was trying to get AFO brace approved by insurance. Pt has had braces in the past, has not had any in about 10 years. Pt goes  to day program everyday in [place] from 10-4.  PERTINENT HISTORY: Low tone Eye lid surgery Autistic PAIN:  Are you having pain? Yes: NPRS scale: unable to report, autistic Pain location: bilateral feet Pain description: hurts Aggravating factors: after a long day of standing Relieving factors: ice pack, patches, compression socks  PRECAUTIONS: None  RED FLAGS: None   WEIGHT BEARING RESTRICTIONS: No  FALLS:  Has patient fallen in last 6 months? No  LIVING ENVIRONMENT: Lives with: lives with their family Lives in: House/apartment Stairs: Yes: External: 1 steps; on right going up Has following equipment at home: None  OCCUPATION: day program attendee  PLOF: Independent with basic ADLs, Independent with transfers, Needs assistance with ADLs, and Needs assistance with homemaking  PATIENT GOALS: decreased the foot pain, be able to walk further, improve gait pattern, for pt to not complain with foot pain  NEXT MD VISIT: next month  OBJECTIVE:  Note: Objective measures were completed at Evaluation unless otherwise noted.  DIAGNOSTIC FINDINGS: Unable to view in chart  PATIENT SURVEYS:  LEFS unable to complete, difficulty communicating with pt  COGNITION: Overall cognitive status: Impaired       EDEMA:  Left foot slight swelling at mid foot and ball of foot  PALPATION: Pt reports pain towards bilateral great toe and metatarsal joints, tenderness reported on bilateral palmar aspect of foot, R worse than L  LOWER EXTREMITY ROM: TBA  Active ROM Right eval Left eval  Hip flexion    Hip extension    Hip abduction    Hip adduction    Hip internal rotation    Hip external rotation    Knee flexion    Knee extension    Ankle dorsiflexion    Ankle plantarflexion    Ankle inversion    Ankle eversion     (Blank rows = not tested)  LOWER EXTREMITY MMT:   MMT Right eval Left eval  Hip flexion 4 4  Hip extension    Hip abduction 4 4  Hip adduction 4 4   Hip internal rotation    Hip external rotation    Knee flexion 4 4  Knee extension 4 4  Ankle dorsiflexion 4- 4-  Ankle plantarflexion 3+ 3+  Ankle inversion 4- 4-  Ankle eversion 4- 4-   (Blank rows = not tested)   FUNCTIONAL TESTS:  5 times sit to stand: TBA 2 minute walk test: 321 feet  SLS R: L:  TBA  GAIT: Distance walked: 400 feet Assistive device utilized: None Level of assistance: Complete Independence Comments: decreased arm swing, pronated foot collapse bilateral, increased on RLE  TREATMENT DATE:  08/13/2024   Evaluation: -ROM measured, Strength assessed, HEP prescribed, pt educated on prognosis, findings, and importance of HEP compliance if given.         PATIENT EDUCATION:  Education details: Pt was educated on findings of PT evaluation, prognosis, frequency of therapy visits and rationale, attendance policy, and HEP if given.   Person educated: Patient and Parent Education method: Explanation, Verbal cues, and Handouts Education comprehension: verbalized understanding, verbal cues required, tactile cues required, and needs further education  HOME EXERCISE PROGRAM: Access Code: [email or web address] URL: [email or web address] Date: 08/13/2024 Prepared by: Lang Ada  Exercises - Supine Bridge  - 1 x daily - 7 x weekly - 3 sets - 10 reps - Supine Lower Trunk Rotation  - 1 x daily - 7 x weekly - 3 sets - 10 reps - Heel Toe Raises with Counter Support  - 1 x daily - 7 x weekly - 3 sets - 10 reps - Heel Raises with Counter Support  - 1 x daily - 7 x weekly - 3 sets - 10 reps  ASSESSMENT:  CLINICAL IMPRESSION: Patient is a 33 y.o. female who was seen today for physical therapy evaluation and treatment for R26.81 (ICD-10-CM) - Gait instability.   Patient demonstrates increased pain in bilateral feet, decreased LE strength,  abnormal swelling of bilateral feet at base of toes, and impaired gait. Patient also demonstrates difficulty with ambulation during today's session with pronated flat feet without ability to correct and decreased trunk and UE activation noted. Patient also demonstrates difficulty communicating pain level and specific location of pain due to cognitive conditions. Patient requires tactile and verbal cues for following one step commands, education on role of PT, prognosis, anatomy of foot and overall POC. Patient would benefit from skilled physical therapy for increased endurance with ambulation, increased LE strength/ROM, and balance for improved gait quality, return to higher level of function with ADLs, and progress towards therapy goals.   OBJECTIVE IMPAIRMENTS: Abnormal gait, decreased activity tolerance, decreased balance, decreased coordination, decreased endurance, decreased knowledge of condition, decreased knowledge of use of DME, decreased mobility, difficulty walking, decreased ROM, decreased strength, hypomobility, and pain.   ACTIVITY LIMITATIONS: carrying, lifting, bending, standing, squatting, sleeping, stairs, transfers, and bed mobility  PARTICIPATION LIMITATIONS: meal prep, cleaning, laundry, medication management, personal finances, interpersonal relationship, driving, shopping, community activity, and yard work  PERSONAL FACTORS: Age, Fitness, Past/current experiences, Time since onset of injury/illness/exacerbation, and 1 comorbidity: IDD are also affecting patient's functional outcome.   REHAB POTENTIAL: Fair chronic in nature  CLINICAL DECISION MAKING: Stable/uncomplicated  EVALUATION COMPLEXITY: Low   GOALS: Goals reviewed with patient? No  SHORT TERM GOALS: Target date: 09/03/24  Patient will demonstrate evidence of independence with individualized HEP and will report compliance for at least 3 days per week for optimized progression towards remaining therapy  goals. Baseline:  Goal status: INITIAL  2.  Patients mother will report a decrease in complaints from pt to less than 6 days per week during community ambulation for improved quality of life. Baseline: 7 days per week Goal status: INITIAL     LONG TERM GOALS: Target date: 09/24/24  Pt will demonstrate decreased bilateral foot swelling with RICE techniques with help of family for decreased discomfort due to swelling in bilateral feet. Baseline: bilateral foot swelling reported Goal status: INITIAL  2.  Pt will improve 2 MWT by 40 feet in order to demonstrate improved functional ambulatory capacity in community setting.  Baseline: see objective Goal status: INITIAL  3.  Patients mother will report a decrease in complaints from pt to less than 3 days per week during community ambulation for improved quality of life. Baseline: 7 days per week Goal status: INITIAL  4.  Pt will demonstrate at least 4+/5 MMT for bilateral lower extremity for increased strength during ADL and community ambulation. Baseline: see objective Goal status: INITIAL  5.  Pt will improve SLS by 5 seconds bilaterally in order to improve balance during functional activities. Baseline: TBA Goal status: INITIAL    PLAN:  PT FREQUENCY: 1-2x/week  PT DURATION: 6 weeks  PLANNED INTERVENTIONS: 97110-Therapeutic exercises, 97530- Therapeutic activity, 97112- Neuromuscular re-education, 97535- Self Care, [id number]- Manual therapy, [phone]- Gait training, Patient/Family education, Balance training, Stair training, Taping, Joint mobilization, Joint manipulation, Spinal manipulation, Spinal mobilization, DME instructions, Cryotherapy, and Moist heat  PLAN FOR NEXT SESSION: progress bilateral foot and ankle mobility and strengthening, review HEP and RICE techniques, assess SLS balance, attempt manual therapy for decreased swelling and improved mobility   Lang Ada, PT, DPT [facility] Office: [phone] 4:11 PM, 08/13/2024

## 2024-08-18 ENCOUNTER — Ambulatory Visit (HOSPITAL_COMMUNITY): Payer: MEDICAID

## 2024-08-20 ENCOUNTER — Ambulatory Visit (HOSPITAL_COMMUNITY): Payer: MEDICAID

## 2024-08-20 ENCOUNTER — Encounter (HOSPITAL_COMMUNITY): Payer: Self-pay

## 2024-08-20 DIAGNOSIS — M79671 Pain in right foot: Secondary | ICD-10-CM | POA: Diagnosis not present

## 2024-08-20 DIAGNOSIS — Z7409 Other reduced mobility: Secondary | ICD-10-CM

## 2024-08-20 NOTE — Therapy (Signed)
 " OUTPATIENT PHYSICAL THERAPY LOWER EXTREMITY TREATMENT   Patient Name: Patricia Santos MRN: [id number] DOB:08-25-1991, 33 y.o., female Today's Date: 08/20/2024  END OF SESSION:  PT End of Session - 08/20/24 0729     Visit Number 2    Date for Recertification  09/24/24    Authorization Type [employer]    Authorization Time Period seeking auth    Progress Note Due on Visit 10    PT Start Time 0729    PT Stop Time 0810    PT Time Calculation (min) 41 min    Activity Tolerance Patient tolerated treatment well;Patient limited by pain    Behavior During Therapy [facility] for tasks assessed/performed           Past Medical History:  Diagnosis Date   Arthritis    feet   Autistic disorder    Complication of anesthesia    need to tape eyelids closed during surgery; eyelids do not close completely since ptosis surgery   Constipation    Difficulty swallowing pills    mixes medication with food   History of petit-mal seizures    no seizures since 2007   Mental retardation    Seasonal allergies    Teeth grinding    Urinary incontinence    mom  has patient on a schedule   Past Surgical History:  Procedure Laterality Date   BELPHAROPTOSIS REPAIR Bilateral    HYSTEROSCOPY WITH D & C N/A 10/26/2015   Procedure: DILATATION AND CURETTAGE /HYSTEROSCOPY;  Surgeon: Duwaine Blumenthal, DO;  Location: [facility];  Service: Gynecology;  Laterality: N/A;   INTRAUTERINE DEVICE (IUD) INSERTION N/A 05/02/2013   Procedure: INTRAUTERINE DEVICE (IUD) INSERTION;  Surgeon: Olam Mill, MD;  Location: [facility];  Service: Gynecology;  Laterality: N/A;   IUD REMOVAL N/A 10/20/2015   Procedure: Attempted INTRAUTERINE DEVICE (IUD) REMOVAL with Hysteroscopy;  Surgeon: Duwaine Blumenthal, DO;  Location: [facility];  Service: Gynecology;  Laterality: N/A;   IUD REMOVAL N/A 10/26/2015   Procedure: INTRAUTERINE DEVICE (IUD) REMOVAL;  Surgeon: Duwaine Blumenthal, DO;  Location: [facility];  Service: Gynecology;  Laterality: N/A;    LAPAROSCOPY N/A 10/26/2015   Procedure: LAPAROSCOPY DIAGNOSTIC with IUD removalwith C Arm;  Surgeon: Duwaine Blumenthal, DO;  Location: [facility];  Service: Gynecology;  Laterality: N/A;   MULTIPLE TOOTH EXTRACTIONS  04/28/2009   #1, #17, #32   MUSCLE BIOPSY     TOENAIL EXCISION Bilateral    for ingrown toenails   TOOTH EXTRACTION N/A 04/08/2014   Procedure: DENTAL RESTORATION/EXTRACTIONS;  Surgeon: Glendia Angles, DDS;  Location: [facility];  Service: Dentistry;  Laterality: N/A;   TOOTH EXTRACTION N/A 07/25/2017   Procedure: EXTRACTION MOLARS NUMBERS 16 AND 31;  Surgeon: Cleotilde Rom, DDS;  Location: [facility];  Service: Oral Surgery;  Laterality: N/A;   TOOTH EXTRACTION N/A 01/06/2022   Procedure: DENTAL RESTORATION/EXTRACTIONS;  Surgeon: Sheryle Glendia, DMD;  Location: [facility];  Service: Oral Surgery;  Laterality: N/A;   WISDOM TOOTH EXTRACTION     Patient Active Problem List   Diagnosis Date Noted   Arthritis 07/10/2017   Autism 07/10/2017   Incontinence 07/10/2017   Intellectual disability 07/10/2017   Constipation 07/11/2011   Hematuria 07/11/2011   Increased frequency of urination 07/11/2011    PCP: Samie Frederick, PA-C   REFERRING PROVIDER: Gershon Donnice SAUNDERS, DPM  REFERRING DIAG: [id number] (ICD-10-CM) - Gait instability  THERAPY DIAG:  Bilateral foot pain  Impaired functional mobility, balance, gait, and endurance  Rationale for Evaluation and Treatment: Rehabilitation  ONSET DATE: worsened last 6 months  SUBJECTIVE:   SUBJECTIVE STATEMENT: Pts mother states that pt has not been complaining as much about her feet since last session. Pts mother reports they have only been performing the HEP they could remember as they had left the HEP printout in car that had to go into the shop.  Eval: Pts mother states pt has flat feet and arthritis has made the feet hurt. Pts mother states she is autistic and has always struggled with walking. States she has noticed  her feet swell some and turn in. Pts mother states she has purchased new balance shoes with inserts. States that podiatry was trying to get AFO brace approved by insurance. Pt has had braces in the past, has not had any in about 10 years. Pt goes to day program everyday in [place] from 10-4.  PERTINENT HISTORY: Low tone Eye lid surgery Autistic PAIN:  Are you having pain? Yes: NPRS scale: unable to report, autistic Pain location: bilateral feet Pain description: hurts Aggravating factors: after a long day of standing Relieving factors: ice pack, patches, compression socks  PRECAUTIONS: None  RED FLAGS: None   WEIGHT BEARING RESTRICTIONS: No  FALLS:  Has patient fallen in last 6 months? No  LIVING ENVIRONMENT: Lives with: lives with their family Lives in: House/apartment Stairs: Yes: External: 1 steps; on right going up Has following equipment at home: None  OCCUPATION: day program attendee  PLOF: Independent with basic ADLs, Independent with transfers, Needs assistance with ADLs, and Needs assistance with homemaking  PATIENT GOALS: decreased the foot pain, be able to walk further, improve gait pattern, for pt to not complain with foot pain  NEXT MD VISIT: next month  OBJECTIVE:  Note: Objective measures were completed at Evaluation unless otherwise noted.  DIAGNOSTIC FINDINGS: Unable to view in chart  PATIENT SURVEYS:  LEFS unable to complete, difficulty communicating with pt  COGNITION: Overall cognitive status: Impaired       EDEMA:  Left foot slight swelling at mid foot and ball of foot  PALPATION: Pt reports pain towards bilateral great toe and metatarsal joints, tenderness reported on bilateral palmar aspect of foot, R worse than L  LOWER EXTREMITY ROM: TBA  Active ROM Right eval Left eval  Hip flexion    Hip extension    Hip abduction    Hip adduction    Hip internal rotation    Hip external rotation    Knee flexion    Knee extension     Ankle dorsiflexion    Ankle plantarflexion    Ankle inversion    Ankle eversion     (Blank rows = not tested)  LOWER EXTREMITY MMT:   MMT Right eval Left eval  Hip flexion 4 4  Hip extension    Hip abduction 4 4  Hip adduction 4 4  Hip internal rotation    Hip external rotation    Knee flexion 4 4  Knee extension 4 4  Ankle dorsiflexion 4- 4-  Ankle plantarflexion 3+ 3+  Ankle inversion 4- 4-  Ankle eversion 4- 4-   (Blank rows = not tested)   FUNCTIONAL TESTS:  5 times sit to stand: TBA 2 minute walk test: 321 feet  SLS R: 2 s L: 5 s  GAIT: Distance walked: 400 feet Assistive device utilized: None Level of assistance: Complete Independence Comments: decreased arm swing, pronated foot collapse bilateral, increased on RLE  TREATMENT DATE:  08/20/2024  Manual Therapy: -Metatarsal and great toe mobilizations, grade II-III, bilaterally -STM of bilateral ankle and foot intrinsic musculature Therapeutic Exercise: -Stationary bike, 3 minutes, level 2 resistance, pt cued for 50-60 spm -Supine bridges, 2 sets of 3 reps, 3 second holds, pt cued for max hip extension -Lower trunk rotations, 1 set of 10 reps, bilaterally, pt cued to remain in pain free ROM -Toe and heel raises, 2 sets of 10 reps, pt cued for increased ROM  Neuromuscular Re-education: -Dorsiflexion marches from 6 inch step, 2 set of 8 reps, pt cued for sequencing and increased dorsiflexor activation -Step up and overs, 1 set of 3 reps, bilaterally, pt cued for increased ankle activation -Forward lunges on bosu ball, 1 set of 5 reps, bilaterally, pt cued for core activation and upright posture -SLS trials, bilaterally added to HEP   08/13/2024  Evaluation: -ROM measured, Strength assessed, HEP prescribed, pt educated on prognosis, findings, and importance of HEP compliance if  given.         PATIENT EDUCATION:  Education details: Pt was educated on findings of PT evaluation, prognosis, frequency of therapy visits and rationale, attendance policy, and HEP if given.   Person educated: Patient and Parent Education method: Explanation, Verbal cues, and Handouts Education comprehension: verbalized understanding, verbal cues required, tactile cues required, and needs further education  HOME EXERCISE PROGRAM: Access Code: [email or web address] URL: [email or web address] Date: 08/20/2024 Prepared by: Lang Ada  Exercises - Supine Bridge  - 1 x daily - 7 x weekly - 3 sets - 10 reps - Supine Lower Trunk Rotation  - 1 x daily - 7 x weekly - 3 sets - 10 reps - Heel Toe Raises with Counter Support  - 1 x daily - 7 x weekly - 3 sets - 10 reps - Heel Raises with Counter Support  - 1 x daily - 7 x weekly - 3 sets - 10 reps - Single Leg Stance with Support  - 1 x daily - 7 x weekly - 1 sets - 3 reps - 30 hold  ASSESSMENT:  CLINICAL IMPRESSION: Patient continues to demonstrate decreased LE strength, decreased gait quality and balance. Patient also demonstrates decreased endurance with aerobic based exercise during today's session on stationary bike with verbal cueing required to maintain proper speed. Patient able to progress dynamic balance and core activation exercises today with SLS and lunge variation, good performance with verbal cueing. Spent increased time with manual therapy on palmar aspect of bilateral feet and toes for improved foot intrinsic activation. Patient would continue to benefit from skilled physical therapy for decreased complaints of foot pain, increased endurance with ambulation, increased LE strength, and improved balance for improved quality of life, improved independence with gait training and continued progress towards therapy goals.  Eval: Patient is a 33 y.o. female who was seen today for physical therapy evaluation and treatment for R26.81  (ICD-10-CM) - Gait instability. Patient demonstrates increased pain in bilateral feet, decreased LE strength, abnormal swelling of bilateral feet at base of toes, and impaired gait. Patient also demonstrates difficulty with ambulation during today's session with pronated flat feet without ability to correct and decreased trunk and UE activation noted. Patient also demonstrates difficulty communicating pain level and specific location of pain due to cognitive conditions. Patient requires tactile and verbal cues for following one step commands, education on role of PT, prognosis, anatomy of foot and overall POC. Patient would benefit from skilled physical therapy for increased endurance with ambulation, increased LE  strength/ROM, and balance for improved gait quality, return to higher level of function with ADLs, and progress towards therapy goals.   OBJECTIVE IMPAIRMENTS: Abnormal gait, decreased activity tolerance, decreased balance, decreased coordination, decreased endurance, decreased knowledge of condition, decreased knowledge of use of DME, decreased mobility, difficulty walking, decreased ROM, decreased strength, hypomobility, and pain.   ACTIVITY LIMITATIONS: carrying, lifting, bending, standing, squatting, sleeping, stairs, transfers, and bed mobility  PARTICIPATION LIMITATIONS: meal prep, cleaning, laundry, medication management, personal finances, interpersonal relationship, driving, shopping, community activity, and yard work  PERSONAL FACTORS: Age, Fitness, Past/current experiences, Time since onset of injury/illness/exacerbation, and 1 comorbidity: IDD are also affecting patient's functional outcome.   REHAB POTENTIAL: Fair chronic in nature  CLINICAL DECISION MAKING: Stable/uncomplicated  EVALUATION COMPLEXITY: Low   GOALS: Goals reviewed with patient? No  SHORT TERM GOALS: Target date: 09/03/24  Patient will demonstrate evidence of independence with individualized HEP and will  report compliance for at least 3 days per week for optimized progression towards remaining therapy goals. Baseline:  Goal status: INITIAL  2.  Patients mother will report a decrease in complaints from pt to less than 6 days per week during community ambulation for improved quality of life. Baseline: 7 days per week Goal status: INITIAL     LONG TERM GOALS: Target date: 09/24/24  Pt will demonstrate decreased bilateral foot swelling with RICE techniques with help of family for decreased discomfort due to swelling in bilateral feet. Baseline: bilateral foot swelling reported Goal status: INITIAL  2.  Pt will improve 2 MWT by 40 feet in order to demonstrate improved functional ambulatory capacity in community setting.  Baseline: see objective Goal status: INITIAL  3.  Patients mother will report a decrease in complaints from pt to less than 3 days per week during community ambulation for improved quality of life. Baseline: 7 days per week Goal status: INITIAL  4.  Pt will demonstrate at least 4+/5 MMT for bilateral lower extremity for increased strength during ADL and community ambulation. Baseline: see objective Goal status: INITIAL  5.  Pt will improve SLS by 5 seconds bilaterally in order to improve balance during functional activities. Baseline: See objective Goal status: INITIAL    PLAN:  PT FREQUENCY: 1-2x/week  PT DURATION: 6 weeks  PLANNED INTERVENTIONS: 97110-Therapeutic exercises, 97530- Therapeutic activity, 97112- Neuromuscular re-education, 97535- Self Care, [id number]- Manual therapy, [phone]- Gait training, Patient/Family education, Balance training, Stair training, Taping, Joint mobilization, Joint manipulation, Spinal manipulation, Spinal mobilization, DME instructions, Cryotherapy, and Moist heat  PLAN FOR NEXT SESSION: progress bilateral foot and ankle mobility and strengthening, review RICE techniques, continue manual therapy for decreased swelling and improved  mobility   Lang Ada, PT, DPT [facility] Office: [phone] 8:30 AM, 08/20/2024  "

## 2024-08-25 ENCOUNTER — Ambulatory Visit (HOSPITAL_COMMUNITY): Payer: MEDICAID

## 2024-08-25 ENCOUNTER — Encounter (HOSPITAL_COMMUNITY): Payer: Self-pay

## 2024-08-25 DIAGNOSIS — Z7409 Other reduced mobility: Secondary | ICD-10-CM

## 2024-08-25 DIAGNOSIS — M79671 Pain in right foot: Secondary | ICD-10-CM | POA: Diagnosis not present

## 2024-08-25 NOTE — Therapy (Signed)
 " OUTPATIENT PHYSICAL THERAPY LOWER EXTREMITY TREATMENT   Patient Name: Patricia Santos MRN: [id number] DOB:Jan 30, 1991, 33 y.o., female Today's Date: 08/25/2024  END OF SESSION:  PT End of Session - 08/25/24 1122     Visit Number 3    Date for Recertification  09/24/24    Authorization Type [employer]    Authorization Time Period seeking auth    Progress Note Due on Visit 10    PT Start Time 1122    PT Stop Time 1205    PT Time Calculation (min) 43 min    Activity Tolerance Patient tolerated treatment well;Patient limited by pain    Behavior During Therapy WFL for tasks assessed/performed            Past Medical History:  Diagnosis Date   Arthritis    feet   Autistic disorder    Complication of anesthesia    need to tape eyelids closed during surgery; eyelids do not close completely since ptosis surgery   Constipation    Difficulty swallowing pills    mixes medication with food   History of petit-mal seizures    no seizures since 2007   Mental retardation    Seasonal allergies    Teeth grinding    Urinary incontinence    mom  has patient on a schedule   Past Surgical History:  Procedure Laterality Date   BELPHAROPTOSIS REPAIR Bilateral    HYSTEROSCOPY WITH D & C N/A 10/26/2015   Procedure: DILATATION AND CURETTAGE /HYSTEROSCOPY;  Surgeon: Duwaine Blumenthal, DO;  Location: [facility];  Service: Gynecology;  Laterality: N/A;   INTRAUTERINE DEVICE (IUD) INSERTION N/A 05/02/2013   Procedure: INTRAUTERINE DEVICE (IUD) INSERTION;  Surgeon: Olam Mill, MD;  Location: [facility];  Service: Gynecology;  Laterality: N/A;   IUD REMOVAL N/A 10/20/2015   Procedure: Attempted INTRAUTERINE DEVICE (IUD) REMOVAL with Hysteroscopy;  Surgeon: Duwaine Blumenthal, DO;  Location: [facility];  Service: Gynecology;  Laterality: N/A;   IUD REMOVAL N/A 10/26/2015   Procedure: INTRAUTERINE DEVICE (IUD) REMOVAL;  Surgeon: Duwaine Blumenthal, DO;  Location: [facility];  Service: Gynecology;  Laterality: N/A;    LAPAROSCOPY N/A 10/26/2015   Procedure: LAPAROSCOPY DIAGNOSTIC with IUD removalwith C Arm;  Surgeon: Duwaine Blumenthal, DO;  Location: [facility];  Service: Gynecology;  Laterality: N/A;   MULTIPLE TOOTH EXTRACTIONS  04/28/2009   #1, #17, #32   MUSCLE BIOPSY     TOENAIL EXCISION Bilateral    for ingrown toenails   TOOTH EXTRACTION N/A 04/08/2014   Procedure: DENTAL RESTORATION/EXTRACTIONS;  Surgeon: Glendia Angles, DDS;  Location: [facility];  Service: Dentistry;  Laterality: N/A;   TOOTH EXTRACTION N/A 07/25/2017   Procedure: EXTRACTION MOLARS NUMBERS 16 AND 31;  Surgeon: Cleotilde Rom, DDS;  Location: [facility];  Service: Oral Surgery;  Laterality: N/A;   TOOTH EXTRACTION N/A 01/06/2022   Procedure: DENTAL RESTORATION/EXTRACTIONS;  Surgeon: Sheryle Glendia, DMD;  Location: [facility];  Service: Oral Surgery;  Laterality: N/A;   WISDOM TOOTH EXTRACTION     Patient Active Problem List   Diagnosis Date Noted   Arthritis 07/10/2017   Autism 07/10/2017   Incontinence 07/10/2017   Intellectual disability 07/10/2017   Constipation 07/11/2011   Hematuria 07/11/2011   Increased frequency of urination 07/11/2011    PCP: Samie Frederick, PA-C   REFERRING PROVIDER: Gershon Donnice SAUNDERS, DPM  REFERRING DIAG: [phone] (ICD-10-CM) - Gait instability  THERAPY DIAG:  Bilateral foot pain  Impaired functional mobility, balance, gait, and  endurance  Rationale for Evaluation and Treatment: Rehabilitation  ONSET DATE: worsened last 6 months  SUBJECTIVE:   SUBJECTIVE STATEMENT: Pts mother states they had a good Christmas, pt has been doing well with the HEP other than the balance, pt often cheats with her elbow. Pt states she has still been complaining a little of the foot pain.   Eval: Pts mother states pt has flat feet and arthritis has made the feet hurt. Pts mother states she is autistic and has always struggled with walking. States she has noticed her feet swell some and turn in.  Pts mother states she has purchased new balance shoes with inserts. States that podiatry was trying to get AFO brace approved by insurance. Pt has had braces in the past, has not had any in about 10 years. Pt goes to day program everyday in [place] from 10-4.  PERTINENT HISTORY: Low tone Eye lid surgery Autistic PAIN:  Are you having pain? Yes: NPRS scale: unable to report, autistic Pain location: bilateral feet Pain description: hurts Aggravating factors: after a long day of standing Relieving factors: ice pack, patches, compression socks  PRECAUTIONS: None  RED FLAGS: None   WEIGHT BEARING RESTRICTIONS: No  FALLS:  Has patient fallen in last 6 months? No  LIVING ENVIRONMENT: Lives with: lives with their family Lives in: House/apartment Stairs: Yes: External: 1 steps; on right going up Has following equipment at home: None  OCCUPATION: day program attendee  PLOF: Independent with basic ADLs, Independent with transfers, Needs assistance with ADLs, and Needs assistance with homemaking  PATIENT GOALS: decreased the foot pain, be able to walk further, improve gait pattern, for pt to not complain with foot pain  NEXT MD VISIT: next month  OBJECTIVE:  Note: Objective measures were completed at Evaluation unless otherwise noted.  DIAGNOSTIC FINDINGS: Unable to view in chart  PATIENT SURVEYS:  LEFS unable to complete, difficulty communicating with pt  COGNITION: Overall cognitive status: Impaired       EDEMA:  Left foot slight swelling at mid foot and ball of foot  PALPATION: Pt reports pain towards bilateral great toe and metatarsal joints, tenderness reported on bilateral palmar aspect of foot, R worse than L  LOWER EXTREMITY ROM: TBA  Active ROM Right eval Left eval  Hip flexion    Hip extension    Hip abduction    Hip adduction    Hip internal rotation    Hip external rotation    Knee flexion    Knee extension    Ankle dorsiflexion    Ankle  plantarflexion    Ankle inversion    Ankle eversion     (Blank rows = not tested)  LOWER EXTREMITY MMT:   MMT Right eval Left eval  Hip flexion 4 4  Hip extension    Hip abduction 4 4  Hip adduction 4 4  Hip internal rotation    Hip external rotation    Knee flexion 4 4  Knee extension 4 4  Ankle dorsiflexion 4- 4-  Ankle plantarflexion 3+ 3+  Ankle inversion 4- 4-  Ankle eversion 4- 4-   (Blank rows = not tested)   FUNCTIONAL TESTS:  5 times sit to stand: TBA 2 minute walk test: 321 feet  SLS R: 2 s L: 5 s  GAIT: Distance walked: 400 feet Assistive device utilized: None Level of assistance: Complete Independence Comments: decreased arm swing, pronated foot collapse bilateral, increased on RLE  TREATMENT DATE:  08/25/2024  Manual Therapy: -Metatarsal and great toe mobilizations, grade II-III, bilaterally -STM of bilateral ankle and foot intrinsic musculature Therapeutic Exercise: -Nustep, 5 minutes, level 5 resistance, pt cued for 50-60 spm -Toe and heel raises, 2 sets of 10 reps, pt cued for increased ROM  -Toe curls and extensions/abductions, 2 sets of 8 reps, pt cued for max ROM (LLE more limited) Neuromuscular Re-education: -Rocker board, DF/PF, 1 set of 15 reps, pt requires tactile and verbal cues for proper form  -Forward lunges on bosu ball, 1 set of 8 reps, bilaterally, pt cued for core activation and upright posture -Aeromat walks, tandem walking 2 laps, pt cued for proper foot alignment  08/20/2024  Manual Therapy: -Metatarsal and great toe mobilizations, grade II-III, bilaterally -STM of bilateral ankle and foot intrinsic musculature Therapeutic Exercise: -Stationary bike, 3 minutes, level 2 resistance, pt cued for 50-60 spm -Supine bridges, 2 sets of 3 reps, 3 second holds, pt cued for max hip extension -Lower trunk  rotations, 1 set of 10 reps, bilaterally, pt cued to remain in pain free ROM -Toe and heel raises, 2 sets of 10 reps, pt cued for increased ROM  Neuromuscular Re-education: -Dorsiflexion marches from 6 inch step, 2 set of 8 reps, pt cued for sequencing and increased dorsiflexor activation -Step up and overs, 1 set of 3 reps, bilaterally, pt cued for increased ankle activation -Forward lunges on bosu ball, 1 set of 5 reps, bilaterally, pt cued for core activation and upright posture -SLS trials, bilaterally added to HEP   08/13/2024  Evaluation: -ROM measured, Strength assessed, HEP prescribed, pt educated on prognosis, findings, and importance of HEP compliance if given.         PATIENT EDUCATION:  Education details: Pt was educated on findings of PT evaluation, prognosis, frequency of therapy visits and rationale, attendance policy, and HEP if given.   Person educated: Patient and Parent Education method: Explanation, Verbal cues, and Handouts Education comprehension: verbalized understanding, verbal cues required, tactile cues required, and needs further education  HOME EXERCISE PROGRAM: Access Code: [email or web address] URL: [email or web address] Date: 08/20/2024 Prepared by: Lang Ada  Exercises - Supine Bridge  - 1 x daily - 7 x weekly - 3 sets - 10 reps - Supine Lower Trunk Rotation  - 1 x daily - 7 x weekly - 3 sets - 10 reps - Heel Toe Raises with Counter Support  - 1 x daily - 7 x weekly - 3 sets - 10 reps - Heel Raises with Counter Support  - 1 x daily - 7 x weekly - 3 sets - 10 reps - Single Leg Stance with Support  - 1 x daily - 7 x weekly - 1 sets - 3 reps - 30 hold  ASSESSMENT:  CLINICAL IMPRESSION: Patient continues to demonstrate decreased LE strength, decreased gait quality and balance. Patient also demonstrates decreased ROM of L foot compared to the right foot. Patient able to progress dynamic balance and core activation exercises today with aeromat  walks and lunge variation, good performance with verbal cueing. Spent increased time with manual therapy on palmar aspect of bilateral feet and toes for coordination of foot intrinsic activation. Patient would continue to benefit from skilled physical therapy for decreased complaints of foot pain, increased endurance with ambulation, increased LE strength, and improved balance for improved quality of life, improved independence with gait training and continued progress towards therapy goals.  Eval: Patient is a 33 y.o. female who was seen today for physical  therapy evaluation and treatment for R26.81 (ICD-10-CM) - Gait instability. Patient demonstrates increased pain in bilateral feet, decreased LE strength, abnormal swelling of bilateral feet at base of toes, and impaired gait. Patient also demonstrates difficulty with ambulation during today's session with pronated flat feet without ability to correct and decreased trunk and UE activation noted. Patient also demonstrates difficulty communicating pain level and specific location of pain due to cognitive conditions. Patient requires tactile and verbal cues for following one step commands, education on role of PT, prognosis, anatomy of foot and overall POC. Patient would benefit from skilled physical therapy for increased endurance with ambulation, increased LE strength/ROM, and balance for improved gait quality, return to higher level of function with ADLs, and progress towards therapy goals.   OBJECTIVE IMPAIRMENTS: Abnormal gait, decreased activity tolerance, decreased balance, decreased coordination, decreased endurance, decreased knowledge of condition, decreased knowledge of use of DME, decreased mobility, difficulty walking, decreased ROM, decreased strength, hypomobility, and pain.   ACTIVITY LIMITATIONS: carrying, lifting, bending, standing, squatting, sleeping, stairs, transfers, and bed mobility  PARTICIPATION LIMITATIONS: meal prep, cleaning,  laundry, medication management, personal finances, interpersonal relationship, driving, shopping, community activity, and yard work  PERSONAL FACTORS: Age, Fitness, Past/current experiences, Time since onset of injury/illness/exacerbation, and 1 comorbidity: IDD are also affecting patient's functional outcome.   REHAB POTENTIAL: Fair chronic in nature  CLINICAL DECISION MAKING: Stable/uncomplicated  EVALUATION COMPLEXITY: Low   GOALS: Goals reviewed with patient? No  SHORT TERM GOALS: Target date: 09/03/24  Patient will demonstrate evidence of independence with individualized HEP and will report compliance for at least 3 days per week for optimized progression towards remaining therapy goals. Baseline:  Goal status: INITIAL  2.  Patients mother will report a decrease in complaints from pt to less than 6 days per week during community ambulation for improved quality of life. Baseline: 7 days per week Goal status: INITIAL     LONG TERM GOALS: Target date: 09/24/24  Pt will demonstrate decreased bilateral foot swelling with RICE techniques with help of family for decreased discomfort due to swelling in bilateral feet. Baseline: bilateral foot swelling reported Goal status: INITIAL  2.  Pt will improve 2 MWT by 40 feet in order to demonstrate improved functional ambulatory capacity in community setting.  Baseline: see objective Goal status: INITIAL  3.  Patients mother will report a decrease in complaints from pt to less than 3 days per week during community ambulation for improved quality of life. Baseline: 7 days per week Goal status: INITIAL  4.  Pt will demonstrate at least 4+/5 MMT for bilateral lower extremity for increased strength during ADL and community ambulation. Baseline: see objective Goal status: INITIAL  5.  Pt will improve SLS by 5 seconds bilaterally in order to improve balance during functional activities. Baseline: See objective Goal status:  INITIAL    PLAN:  PT FREQUENCY: 1-2x/week  PT DURATION: 6 weeks  PLANNED INTERVENTIONS: 97110-Therapeutic exercises, 97530- Therapeutic activity, 97112- Neuromuscular re-education, 97535- Self Care, [id number]- Manual therapy, [phone]- Gait training, Patient/Family education, Balance training, Stair training, Taping, Joint mobilization, Joint manipulation, Spinal manipulation, Spinal mobilization, DME instructions, Cryotherapy, and Moist heat  PLAN FOR NEXT SESSION: progress bilateral foot and ankle mobility and strengthening, review RICE techniques, continue manual therapy for decreased swelling and improved mobility   Lang Ada, PT, DPT [facility] Office: [phone] 12:21 PM, 08/25/2024  "

## 2024-09-05 ENCOUNTER — Ambulatory Visit (HOSPITAL_COMMUNITY): Payer: MEDICAID | Attending: Podiatry

## 2024-09-05 ENCOUNTER — Encounter (HOSPITAL_COMMUNITY): Payer: Self-pay

## 2024-09-05 DIAGNOSIS — Z7409 Other reduced mobility: Secondary | ICD-10-CM | POA: Insufficient documentation

## 2024-09-05 DIAGNOSIS — M79672 Pain in left foot: Secondary | ICD-10-CM | POA: Insufficient documentation

## 2024-09-05 DIAGNOSIS — M79671 Pain in right foot: Secondary | ICD-10-CM | POA: Diagnosis present

## 2024-09-05 NOTE — Therapy (Signed)
 " OUTPATIENT PHYSICAL THERAPY LOWER EXTREMITY TREATMENT   Patient Name: Patricia Santos MRN: [id number] DOB:[birth year], 34 y.o., female Today's Date: 09/05/2024  END OF SESSION:  PT End of Session - 09/05/24 0817     Visit Number 4    Date for Recertification  09/24/24    Authorization Type [employer]    Authorization Time Period seeking auth    Progress Note Due on Visit 10    PT Start Time 0818    PT Stop Time 0858    PT Time Calculation (min) 40 min    Activity Tolerance Patient tolerated treatment well;Patient limited by pain    Behavior During Therapy [facility] for tasks assessed/performed             Past Medical History:  Diagnosis Date   Arthritis    feet   Autistic disorder    Complication of anesthesia    need to tape eyelids closed during surgery; eyelids do not close completely since ptosis surgery   Constipation    Difficulty swallowing pills    mixes medication with food   History of petit-mal seizures    no seizures since 2007   Mental retardation    Seasonal allergies    Teeth grinding    Urinary incontinence    mom  has patient on a schedule   Past Surgical History:  Procedure Laterality Date   BELPHAROPTOSIS REPAIR Bilateral    HYSTEROSCOPY WITH D & C N/A 10/26/2015   Procedure: DILATATION AND CURETTAGE /HYSTEROSCOPY;  Surgeon: Duwaine Blumenthal, DO;  Location: [facility];  Service: Gynecology;  Laterality: N/A;   INTRAUTERINE DEVICE (IUD) INSERTION N/A 05/02/2013   Procedure: INTRAUTERINE DEVICE (IUD) INSERTION;  Surgeon: Olam Mill, MD;  Location: [facility];  Service: Gynecology;  Laterality: N/A;   IUD REMOVAL N/A 10/20/2015   Procedure: Attempted INTRAUTERINE DEVICE (IUD) REMOVAL with Hysteroscopy;  Surgeon: Duwaine Blumenthal, DO;  Location: [facility];  Service: Gynecology;  Laterality: N/A;   IUD REMOVAL N/A 10/26/2015   Procedure: INTRAUTERINE DEVICE (IUD) REMOVAL;  Surgeon: Duwaine Blumenthal, DO;  Location: [facility];  Service: Gynecology;  Laterality: N/A;    LAPAROSCOPY N/A 10/26/2015   Procedure: LAPAROSCOPY DIAGNOSTIC with IUD removalwith C Arm;  Surgeon: Duwaine Blumenthal, DO;  Location: [facility];  Service: Gynecology;  Laterality: N/A;   MULTIPLE TOOTH EXTRACTIONS  04/28/2009   #1, #17, #32   MUSCLE BIOPSY     TOENAIL EXCISION Bilateral    for ingrown toenails   TOOTH EXTRACTION N/A 04/08/2014   Procedure: DENTAL RESTORATION/EXTRACTIONS;  Surgeon: Glendia Angles, DDS;  Location: [facility];  Service: Dentistry;  Laterality: N/A;   TOOTH EXTRACTION N/A 07/25/2017   Procedure: EXTRACTION MOLARS NUMBERS 16 AND 31;  Surgeon: Cleotilde Rom, DDS;  Location: [facility];  Service: Oral Surgery;  Laterality: N/A;   TOOTH EXTRACTION N/A 01/06/2022   Procedure: DENTAL RESTORATION/EXTRACTIONS;  Surgeon: Sheryle Glendia, DMD;  Location: [facility];  Service: Oral Surgery;  Laterality: N/A;   WISDOM TOOTH EXTRACTION     Patient Active Problem List   Diagnosis Date Noted   Arthritis 07/10/2017   Autism 07/10/2017   Incontinence 07/10/2017   Intellectual disability 07/10/2017   Constipation 07/11/2011   Hematuria 07/11/2011   Increased frequency of urination 07/11/2011    PCP: Samie Frederick, PA-C   REFERRING PROVIDER: Gershon Donnice SAUNDERS, DPM  REFERRING DIAG: [phone] (ICD-10-CM) - Gait instability  THERAPY DIAG:  Bilateral foot pain  Impaired functional mobility, balance, gait,  and endurance  Rationale for Evaluation and Treatment: Rehabilitation  ONSET DATE: worsened last 6 months  SUBJECTIVE:   SUBJECTIVE STATEMENT: Pt states she has been doing the exercises, pts mom states she has been asking to do them. Pts mother states she has noticed pts feet have not been swelling as much and pt has not been complaining of feet hurting as much.   Eval: Pts mother states pt has flat feet and arthritis has made the feet hurt. Pts mother states she is autistic and has always struggled with walking. States she has noticed her feet swell  some and turn in. Pts mother states she has purchased new balance shoes with inserts. States that podiatry was trying to get AFO brace approved by insurance. Pt has had braces in the past, has not had any in about 10 years. Pt goes to day program everyday in [place] from 10-4.  PERTINENT HISTORY: Low tone Eye lid surgery Autistic PAIN:  Are you having pain? Yes: NPRS scale: unable to report, autistic Pain location: bilateral feet Pain description: hurts Aggravating factors: after a long day of standing Relieving factors: ice pack, patches, compression socks  PRECAUTIONS: None  RED FLAGS: None   WEIGHT BEARING RESTRICTIONS: No  FALLS:  Has patient fallen in last 6 months? No  LIVING ENVIRONMENT: Lives with: lives with their family Lives in: House/apartment Stairs: Yes: External: 1 steps; on right going up Has following equipment at home: None  OCCUPATION: day program attendee  PLOF: Independent with basic ADLs, Independent with transfers, Needs assistance with ADLs, and Needs assistance with homemaking  PATIENT GOALS: decreased the foot pain, be able to walk further, improve gait pattern, for pt to not complain with foot pain  NEXT MD VISIT: next month  OBJECTIVE:  Note: Objective measures were completed at Evaluation unless otherwise noted.  DIAGNOSTIC FINDINGS: Unable to view in chart  PATIENT SURVEYS:  LEFS unable to complete, difficulty communicating with pt  COGNITION: Overall cognitive status: Impaired       EDEMA:  Left foot slight swelling at mid foot and ball of foot  PALPATION: Pt reports pain towards bilateral great toe and metatarsal joints, tenderness reported on bilateral palmar aspect of foot, R worse than L  LOWER EXTREMITY ROM: TBA  Active ROM Right eval Left eval  Hip flexion    Hip extension    Hip abduction    Hip adduction    Hip internal rotation    Hip external rotation    Knee flexion    Knee extension    Ankle  dorsiflexion    Ankle plantarflexion    Ankle inversion    Ankle eversion     (Blank rows = not tested)  LOWER EXTREMITY MMT:   MMT Right eval Left eval  Hip flexion 4 4  Hip extension    Hip abduction 4 4  Hip adduction 4 4  Hip internal rotation    Hip external rotation    Knee flexion 4 4  Knee extension 4 4  Ankle dorsiflexion 4- 4-  Ankle plantarflexion 3+ 3+  Ankle inversion 4- 4-  Ankle eversion 4- 4-   (Blank rows = not tested)   FUNCTIONAL TESTS:  5 times sit to stand: TBA 2 minute walk test: 321 feet  SLS R: 2 s L: 5 s  GAIT: Distance walked: 400 feet Assistive device utilized: None Level of assistance: Complete Independence Comments: decreased arm swing, pronated foot collapse bilateral, increased on RLE  TREATMENT DATE:  09/05/2024  Manual Therapy: -Metatarsal and great toe mobilizations, grade II-III, bilaterally Therapeutic Exercise: -Stationary bike, 5 minutes, level 3 resistance, pt cued for 50-60 spm -Toe and heel raises, 2 sets of 10 reps, pt cued for increased ROM  -Toe curls and extensions/abductions on towel, 2 sets of 8 reps, pt cued for max ROM (LLE more limited) Neuromuscular Re-education: -Rocker board, DF/PF, 1 set of 20 reps, EV/IN 15 reps, pt requires tactile and verbal cues for proper form  -Forward lunges on 24 inch elevated step, 1 set of 8 reps, bilaterally, pt cued for UE support and upright posture -Resisted lateral stepping walks, 2 laps in parallel bars, pt cued for proper foot alignment -Sled push, 20lbs, pt cued for max ankle ROM, tactile cuing required, 2 laps, 40 feet per lap  08/25/2024  Manual Therapy: -Metatarsal and great toe mobilizations, grade II-III, bilaterally -STM of bilateral ankle and foot intrinsic musculature Therapeutic Exercise: -Nustep, 5 minutes, level 5 resistance, pt cued for  50-60 spm -Toe and heel raises, 2 sets of 10 reps, pt cued for increased ROM  -Toe curls and extensions/abductions, 2 sets of 8 reps, pt cued for max ROM (LLE more limited) Neuromuscular Re-education: -Rocker board, DF/PF, 1 set of 15 reps, pt requires tactile and verbal cues for proper form  -Forward lunges on bosu ball, 1 set of 8 reps, bilaterally, pt cued for core activation and upright posture -Aeromat walks, tandem walking 2 laps, pt cued for proper foot alignment  08/20/2024  Manual Therapy: -Metatarsal and great toe mobilizations, grade II-III, bilaterally -STM of bilateral ankle and foot intrinsic musculature Therapeutic Exercise: -Stationary bike, 3 minutes, level 2 resistance, pt cued for 50-60 spm -Supine bridges, 2 sets of 3 reps, 3 second holds, pt cued for max hip extension -Lower trunk rotations, 1 set of 10 reps, bilaterally, pt cued to remain in pain free ROM -Toe and heel raises, 2 sets of 10 reps, pt cued for increased ROM  Neuromuscular Re-education: -Dorsiflexion marches from 6 inch step, 2 set of 8 reps, pt cued for sequencing and increased dorsiflexor activation -Step up and overs, 1 set of 3 reps, bilaterally, pt cued for increased ankle activation -Forward lunges on bosu ball, 1 set of 5 reps, bilaterally, pt cued for core activation and upright posture -SLS trials, bilaterally added to HEP       PATIENT EDUCATION:  Education details: Pt was educated on findings of PT evaluation, prognosis, frequency of therapy visits and rationale, attendance policy, and HEP if given.   Person educated: Patient and Parent Education method: Explanation, Verbal cues, and Handouts Education comprehension: verbalized understanding, verbal cues required, tactile cues required, and needs further education  HOME EXERCISE PROGRAM: Access Code: [email or web address] URL: [email or web address] Date: 08/20/2024 Prepared by: Lang Ada  Exercises - Supine Bridge  - 1 x daily -  7 x weekly - 3 sets - 10 reps - Supine Lower Trunk Rotation  - 1 x daily - 7 x weekly - 3 sets - 10 reps - Heel Toe Raises with Counter Support  - 1 x daily - 7 x weekly - 3 sets - 10 reps - Heel Raises with Counter Support  - 1 x daily - 7 x weekly - 3 sets - 10 reps - Single Leg Stance with Support  - 1 x daily - 7 x weekly - 1 sets - 3 reps - 30 hold  ASSESSMENT:  CLINICAL IMPRESSION: Patient continues to demonstrate decreased LE strength/mobility, decreased gait  quality and balance. Patient also demonstrates decreased endurance with aerobic based exercise during today's session, decreased pace without verbal cuing for increased speed. Patient able to progress dynamic balance and core activation exercises today with banded walk and lunge variation, good performance with verbal cueing. Patient would continue to benefit from skilled physical therapy for increased endurance with ambulation, increased LE strength/mobility, and improved balance for improved quality of life, improved independence with gait training and continued progress towards therapy goals.   Eval: Patient is a 34 y.o. female who was seen today for physical therapy evaluation and treatment for R26.81 (ICD-10-CM) - Gait instability. Patient demonstrates increased pain in bilateral feet, decreased LE strength, abnormal swelling of bilateral feet at base of toes, and impaired gait. Patient also demonstrates difficulty with ambulation during today's session with pronated flat feet without ability to correct and decreased trunk and UE activation noted. Patient also demonstrates difficulty communicating pain level and specific location of pain due to cognitive conditions. Patient requires tactile and verbal cues for following one step commands, education on role of PT, prognosis, anatomy of foot and overall POC. Patient would benefit from skilled physical therapy for increased endurance with ambulation, increased LE strength/ROM, and balance  for improved gait quality, return to higher level of function with ADLs, and progress towards therapy goals.   OBJECTIVE IMPAIRMENTS: Abnormal gait, decreased activity tolerance, decreased balance, decreased coordination, decreased endurance, decreased knowledge of condition, decreased knowledge of use of DME, decreased mobility, difficulty walking, decreased ROM, decreased strength, hypomobility, and pain.   ACTIVITY LIMITATIONS: carrying, lifting, bending, standing, squatting, sleeping, stairs, transfers, and bed mobility  PARTICIPATION LIMITATIONS: meal prep, cleaning, laundry, medication management, personal finances, interpersonal relationship, driving, shopping, community activity, and yard work  PERSONAL FACTORS: Age, Fitness, Past/current experiences, Time since onset of injury/illness/exacerbation, and 1 comorbidity: IDD are also affecting patient's functional outcome.   REHAB POTENTIAL: Fair chronic in nature  CLINICAL DECISION MAKING: Stable/uncomplicated  EVALUATION COMPLEXITY: Low   GOALS: Goals reviewed with patient? No  SHORT TERM GOALS: Target date: 09/03/24  Patient will demonstrate evidence of independence with individualized HEP and will report compliance for at least 3 days per week for optimized progression towards remaining therapy goals. Baseline:  Goal status: INITIAL  2.  Patients mother will report a decrease in complaints from pt to less than 6 days per week during community ambulation for improved quality of life. Baseline: 7 days per week Goal status: INITIAL     LONG TERM GOALS: Target date: 09/24/24  Pt will demonstrate decreased bilateral foot swelling with RICE techniques with help of family for decreased discomfort due to swelling in bilateral feet. Baseline: bilateral foot swelling reported Goal status: INITIAL  2.  Pt will improve 2 MWT by 40 feet in order to demonstrate improved functional ambulatory capacity in community setting.   Baseline: see objective Goal status: INITIAL  3.  Patients mother will report a decrease in complaints from pt to less than 3 days per week during community ambulation for improved quality of life. Baseline: 7 days per week Goal status: INITIAL  4.  Pt will demonstrate at least 4+/5 MMT for bilateral lower extremity for increased strength during ADL and community ambulation. Baseline: see objective Goal status: INITIAL  5.  Pt will improve SLS by 5 seconds bilaterally in order to improve balance during functional activities. Baseline: See objective Goal status: INITIAL    PLAN:  PT FREQUENCY: 1-2x/week  PT DURATION: 6 weeks  PLANNED INTERVENTIONS: 97110-Therapeutic exercises, 97530-  Therapeutic activity, [id number]- Neuromuscular re-education, [phone]- Self Care, [phone]- Manual therapy, [phone]- Gait training, Patient/Family education, Balance training, Stair training, Taping, Joint mobilization, Joint manipulation, Spinal manipulation, Spinal mobilization, DME instructions, Cryotherapy, and Moist heat  PLAN FOR NEXT SESSION: progress bilateral foot and ankle mobility and strengthening, review RICE techniques, continue manual therapy for decreased swelling and improved mobility   Lang Ada, PT, DPT [facility] Office: [phone] 9:04 AM, 09/05/2024  "

## 2024-09-10 ENCOUNTER — Ambulatory Visit (HOSPITAL_COMMUNITY): Payer: MEDICAID

## 2024-09-11 ENCOUNTER — Ambulatory Visit (HOSPITAL_COMMUNITY): Payer: MEDICAID

## 2024-09-15 ENCOUNTER — Ambulatory Visit: Payer: MEDICAID | Admitting: Podiatry

## 2024-09-15 DIAGNOSIS — M79674 Pain in right toe(s): Secondary | ICD-10-CM | POA: Diagnosis not present

## 2024-09-15 DIAGNOSIS — M2141 Flat foot [pes planus] (acquired), right foot: Secondary | ICD-10-CM | POA: Diagnosis not present

## 2024-09-15 DIAGNOSIS — M79675 Pain in left toe(s): Secondary | ICD-10-CM

## 2024-09-15 DIAGNOSIS — M2142 Flat foot [pes planus] (acquired), left foot: Secondary | ICD-10-CM | POA: Diagnosis not present

## 2024-09-15 DIAGNOSIS — B351 Tinea unguium: Secondary | ICD-10-CM

## 2024-09-15 DIAGNOSIS — R2681 Unsteadiness on feet: Secondary | ICD-10-CM | POA: Diagnosis not present

## 2024-09-15 MED ORDER — DICLOFENAC EPOLAMINE 1.3 % EX PTCH: MEDICATED_PATCH | CUTANEOUS | 2 refills | Status: AC

## 2024-09-15 NOTE — Progress Notes (Signed)
 Subjective:   Patient ID: Patricia Santos, female   DOB: 34 y.o.   MRN: [id number]   HPI  Chief Complaint  Patient presents with   RFC    Patient presents today for a RFC non diabetic     34 year old female presents with mother for elongated nails that she is not able to trim herself as the cause discomfort.    Regards to her foot pain she has been doing better.  Physical therapy is been helping has not been swelling.  She does use a flutter patches which does help.  She has not been complaining of pain.     ROS      Objective:  General: NAD  Dermatological: Nails are hypertrophic, dystrophic, brittle, discolored, elongated 10. No surrounding redness or drainage. Tenderness nails 1-5 bilaterally. No open lesions or pre-ulcerative lesions are identified today.  Vascular: Dorsalis Pedis artery and Posterior Tibial artery pedal pulses are 2/4 bilateral with immedate capillary fill time. There is no pain with calf compression, swelling, warmth, erythema.   Neruologic: Grossly intact via light touch bilateral.   Musculoskeletal: Unable to appreciate any area pinpoint tenderness.  There is no send edema is and there is no erythema or warmth.  Ankle, subtalar range of motion intact. Flatfoot present.    Assessment:   Symptomatic onychomycosis, chronic foot pain    Plan:  Symptomatic onychomycosis -Sharply debrided the nails x 10 without any complications or bleeding  Chronic foot pain - Overall doing much better she reports.  She continue physical therapy continue supportive shoe gear.  We have submitted authorization for a brace.  She seemed to be doing well without it at this time however.  Return in about 3 months (around 12/14/2024).  Donnice JONELLE Fees DPM

## 2024-09-16 ENCOUNTER — Encounter (HOSPITAL_COMMUNITY): Payer: Self-pay

## 2024-09-16 ENCOUNTER — Ambulatory Visit (HOSPITAL_COMMUNITY): Payer: MEDICAID

## 2024-09-16 DIAGNOSIS — M79671 Pain in right foot: Secondary | ICD-10-CM

## 2024-09-16 DIAGNOSIS — Z7409 Other reduced mobility: Secondary | ICD-10-CM

## 2024-09-16 NOTE — Therapy (Signed)
 " OUTPATIENT PHYSICAL THERAPY LOWER EXTREMITY TREATMENT   Patient Name: Patricia Santos MRN: [id number] DOB:[birth year], 34 y.o., female Today's Date: 09/16/2024  END OF SESSION:  PT End of Session - 09/16/24 0813     Visit Number 5    Date for Recertification  09/24/24    Authorization Type [employer]    Authorization Time Period approved 40 visits from 08/13/25-10/10/24([id number])    Authorization - Visit Number 5    Authorization - Number of Visits 40    Progress Note Due on Visit 10    PT Start Time 0815    PT Stop Time 0853    PT Time Calculation (min) 38 min    Activity Tolerance Patient tolerated treatment well    Behavior During Therapy WFL for tasks assessed/performed              Past Medical History:  Diagnosis Date   Arthritis    feet   Autistic disorder    Complication of anesthesia    need to tape eyelids closed during surgery; eyelids do not close completely since ptosis surgery   Constipation    Difficulty swallowing pills    mixes medication with food   History of petit-mal seizures    no seizures since 2007   Mental retardation    Seasonal allergies    Teeth grinding    Urinary incontinence    mom  has patient on a schedule   Past Surgical History:  Procedure Laterality Date   BELPHAROPTOSIS REPAIR Bilateral    HYSTEROSCOPY WITH D & C N/A 10/26/2015   Procedure: DILATATION AND CURETTAGE /HYSTEROSCOPY;  Surgeon: Duwaine Blumenthal, DO;  Location: [facility];  Service: Gynecology;  Laterality: N/A;   INTRAUTERINE DEVICE (IUD) INSERTION N/A 05/02/2013   Procedure: INTRAUTERINE DEVICE (IUD) INSERTION;  Surgeon: Olam Mill, MD;  Location: [facility];  Service: Gynecology;  Laterality: N/A;   IUD REMOVAL N/A 10/20/2015   Procedure: Attempted INTRAUTERINE DEVICE (IUD) REMOVAL with Hysteroscopy;  Surgeon: Duwaine Blumenthal, DO;  Location: [facility];  Service: Gynecology;  Laterality: N/A;   IUD REMOVAL N/A 10/26/2015   Procedure: INTRAUTERINE DEVICE (IUD)  REMOVAL;  Surgeon: Duwaine Blumenthal, DO;  Location: [facility];  Service: Gynecology;  Laterality: N/A;   LAPAROSCOPY N/A 10/26/2015   Procedure: LAPAROSCOPY DIAGNOSTIC with IUD removalwith C Arm;  Surgeon: Duwaine Blumenthal, DO;  Location: [facility];  Service: Gynecology;  Laterality: N/A;   MULTIPLE TOOTH EXTRACTIONS  04/28/2009   #1, #17, #32   MUSCLE BIOPSY     TOENAIL EXCISION Bilateral    for ingrown toenails   TOOTH EXTRACTION N/A 04/08/2014   Procedure: DENTAL RESTORATION/EXTRACTIONS;  Surgeon: Glendia Angles, DDS;  Location: [facility];  Service: Dentistry;  Laterality: N/A;   TOOTH EXTRACTION N/A 07/25/2017   Procedure: EXTRACTION MOLARS NUMBERS 16 AND 31;  Surgeon: Cleotilde Rom, DDS;  Location: [facility];  Service: Oral Surgery;  Laterality: N/A;   TOOTH EXTRACTION N/A 01/06/2022   Procedure: DENTAL RESTORATION/EXTRACTIONS;  Surgeon: Sheryle Glendia, DMD;  Location: [facility];  Service: Oral Surgery;  Laterality: N/A;   WISDOM TOOTH EXTRACTION     Patient Active Problem List   Diagnosis Date Noted   Arthritis 07/10/2017   Autism 07/10/2017   Incontinence 07/10/2017   Intellectual disability 07/10/2017   Constipation 07/11/2011   Hematuria 07/11/2011   Increased frequency of urination 07/11/2011    PCP: Samie Frederick, PA-C   REFERRING PROVIDER: Gershon Donnice SAUNDERS, DPM  REFERRING DIAG:  R26.81 (ICD-10-CM) - Gait instability  THERAPY DIAG:  Bilateral foot pain  Impaired functional mobility, balance, gait, and endurance  Rationale for Evaluation and Treatment: Rehabilitation  ONSET DATE: worsened last 6 months  SUBJECTIVE:   SUBJECTIVE STATEMENT: Pt states doctor has not heard about AFO brace. Pts mom states that pts feet have not been swelling as much and pt has not been complaining of feet hurting as often. Pts mom states she has been doing the tennis ball massage and ankle HEP on her own pretty good.    Eval: Pts mother states pt has flat feet and arthritis  has made the feet hurt. Pts mother states she is autistic and has always struggled with walking. States she has noticed her feet swell some and turn in. Pts mother states she has purchased new balance shoes with inserts. States that podiatry was trying to get AFO brace approved by insurance. Pt has had braces in the past, has not had any in about 10 years. Pt goes to day program everyday in [place] from 10-4.  PERTINENT HISTORY: Low tone Eye lid surgery Autistic PAIN:  Are you having pain? Yes: NPRS scale: unable to report, autistic Pain location: bilateral feet Pain description: hurts Aggravating factors: after a long day of standing Relieving factors: ice pack, patches, compression socks  PRECAUTIONS: None  RED FLAGS: None   WEIGHT BEARING RESTRICTIONS: No  FALLS:  Has patient fallen in last 6 months? No  LIVING ENVIRONMENT: Lives with: lives with their family Lives in: House/apartment Stairs: Yes: External: 1 steps; on right going up Has following equipment at home: None  OCCUPATION: day program attendee  PLOF: Independent with basic ADLs, Independent with transfers, Needs assistance with ADLs, and Needs assistance with homemaking  PATIENT GOALS: decreased the foot pain, be able to walk further, improve gait pattern, for pt to not complain with foot pain  NEXT MD VISIT: next month  OBJECTIVE:  Note: Objective measures were completed at Evaluation unless otherwise noted.  DIAGNOSTIC FINDINGS: Unable to view in chart  PATIENT SURVEYS:  LEFS unable to complete, difficulty communicating with pt  COGNITION: Overall cognitive status: Impaired       EDEMA:  Left foot slight swelling at mid foot and ball of foot  PALPATION: Pt reports pain towards bilateral great toe and metatarsal joints, tenderness reported on bilateral palmar aspect of foot, R worse than L  LOWER EXTREMITY ROM: TBA  Active ROM Right eval Left eval  Hip flexion    Hip extension    Hip  abduction    Hip adduction    Hip internal rotation    Hip external rotation    Knee flexion    Knee extension    Ankle dorsiflexion    Ankle plantarflexion    Ankle inversion    Ankle eversion     (Blank rows = not tested)  LOWER EXTREMITY MMT:   MMT Right eval Left eval  Hip flexion 4 4  Hip extension    Hip abduction 4 4  Hip adduction 4 4  Hip internal rotation    Hip external rotation    Knee flexion 4 4  Knee extension 4 4  Ankle dorsiflexion 4- 4-  Ankle plantarflexion 3+ 3+  Ankle inversion 4- 4-  Ankle eversion 4- 4-   (Blank rows = not tested)   FUNCTIONAL TESTS:  5 times sit to stand: TBA 2 minute walk test: 321 feet  SLS R: 2 s L: 5 s  GAIT: Distance  walked: 400 feet Assistive device utilized: None Level of assistance: Complete Independence Comments: decreased arm swing, pronated foot collapse bilateral, increased on RLE                                                                                                                                TREATMENT DATE:  09/16/2024  Manual Therapy: -Metatarsal and great toe mobilizations, grade II-III, bilaterally -STM of bilateral ankle and foot intrinsic musculature Therapeutic Exercise: -Stationary bike, 5 minutes, level 4 resistance, pt cued for 50-60 spm -Toe and heel raises, 2 sets of 10 reps, pt cued for increased ROM  -Dorsiflexion marches from blue foam, 10lb kettle bell, pt cued for max ROM Therapeutic Activity: -Lateral step up and overs, 8 inch step, 1 sets of 5 reps, bilaterally, pt cued for decreased UE support and  -Step ups, increased step length for foot landing clearance -Cone touches from blue foam, 2 sets of 7 reps bilaterally, pt cued for sequencing -Walking marches/butt kicks, toe/heel walks, 1 lap on 20 foot line, tidal tank sphere at chest, pt cued for max ROM and sustained holds -Sit to stands with tidal sphere, with alternating marches, 1 set of 8 reps, pt cued for increased hip  ROM  09/05/2024  Manual Therapy: -Metatarsal and great toe mobilizations, grade II-III, bilaterally Therapeutic Exercise: -Stationary bike, 5 minutes, level 3 resistance, pt cued for 50-60 spm -Toe and heel raises, 2 sets of 10 reps, pt cued for increased ROM  -Toe curls and extensions/abductions on towel, 2 sets of 8 reps, pt cued for max ROM (LLE more limited) Neuromuscular Re-education: -Rocker board, DF/PF, 1 set of 20 reps, EV/IN 15 reps, pt requires tactile and verbal cues for proper form  -Forward lunges on 24 inch elevated step, 1 set of 8 reps, bilaterally, pt cued for UE support and upright posture -Resisted lateral stepping walks, 2 laps in parallel bars, pt cued for proper foot alignment -Sled push, 20lbs, pt cued for max ankle ROM, tactile cuing required, 2 laps, 40 feet per lap  08/25/2024  Manual Therapy: -Metatarsal and great toe mobilizations, grade II-III, bilaterally -STM of bilateral ankle and foot intrinsic musculature Therapeutic Exercise: -Nustep, 5 minutes, level 5 resistance, pt cued for 50-60 spm -Toe and heel raises, 2 sets of 10 reps, pt cued for increased ROM  -Toe curls and extensions/abductions, 2 sets of 8 reps, pt cued for max ROM (LLE more limited) Neuromuscular Re-education: -Rocker board, DF/PF, 1 set of 15 reps, pt requires tactile and verbal cues for proper form  -Forward lunges on bosu ball, 1 set of 8 reps, bilaterally, pt cued for core activation and upright posture -Aeromat walks, tandem walking 2 laps, pt cued for proper foot alignment   PATIENT EDUCATION:  Education details: Pt was educated on findings of PT evaluation, prognosis, frequency of therapy visits and rationale, attendance policy, and HEP if given.   Person educated: Patient and Parent Education method:  Explanation, Verbal cues, and Handouts Education comprehension: verbalized understanding, verbal cues required, tactile cues required, and needs further education  HOME EXERCISE  PROGRAM: Access Code: [email or web address] URL: [email or web address] Date: 08/20/2024 Prepared by: Lang Ada  Exercises - Supine Bridge  - 1 x daily - 7 x weekly - 3 sets - 10 reps - Supine Lower Trunk Rotation  - 1 x daily - 7 x weekly - 3 sets - 10 reps - Heel Toe Raises with Counter Support  - 1 x daily - 7 x weekly - 3 sets - 10 reps - Heel Raises with Counter Support  - 1 x daily - 7 x weekly - 3 sets - 10 reps - Single Leg Stance with Support  - 1 x daily - 7 x weekly - 1 sets - 3 reps - 30 hold  ASSESSMENT:  CLINICAL IMPRESSION: Patient continues to demonstrate decreased LE strength/mobility, decreased gait quality and balance. Patient also demonstrates increased endurance with aerobic based exercise during today's session, able to increased resistance and sustain verbal cued pace of 50 spm this date. Patient able to progress dynamic balance and core activation exercises today with walking and sit to stand variations, good performance with verbal cueing. Patient would continue to benefit from skilled physical therapy for increased endurance with ambulation, increased LE strength/mobility, and improved balance for improved quality of life, improved independence with gait training and continued progress towards therapy goals.   Eval: Patient is a 34 y.o. female who was seen today for physical therapy evaluation and treatment for R26.81 (ICD-10-CM) - Gait instability. Patient demonstrates increased pain in bilateral feet, decreased LE strength, abnormal swelling of bilateral feet at base of toes, and impaired gait. Patient also demonstrates difficulty with ambulation during today's session with pronated flat feet without ability to correct and decreased trunk and UE activation noted. Patient also demonstrates difficulty communicating pain level and specific location of pain due to cognitive conditions. Patient requires tactile and verbal cues for following one step commands, education on  role of PT, prognosis, anatomy of foot and overall POC. Patient would benefit from skilled physical therapy for increased endurance with ambulation, increased LE strength/ROM, and balance for improved gait quality, return to higher level of function with ADLs, and progress towards therapy goals.   OBJECTIVE IMPAIRMENTS: Abnormal gait, decreased activity tolerance, decreased balance, decreased coordination, decreased endurance, decreased knowledge of condition, decreased knowledge of use of DME, decreased mobility, difficulty walking, decreased ROM, decreased strength, hypomobility, and pain.   ACTIVITY LIMITATIONS: carrying, lifting, bending, standing, squatting, sleeping, stairs, transfers, and bed mobility  PARTICIPATION LIMITATIONS: meal prep, cleaning, laundry, medication management, personal finances, interpersonal relationship, driving, shopping, community activity, and yard work  PERSONAL FACTORS: Age, Fitness, Past/current experiences, Time since onset of injury/illness/exacerbation, and 1 comorbidity: IDD are also affecting patient's functional outcome.   REHAB POTENTIAL: Fair chronic in nature  CLINICAL DECISION MAKING: Stable/uncomplicated  EVALUATION COMPLEXITY: Low   GOALS: Goals reviewed with patient? No  SHORT TERM GOALS: Target date: 09/03/24  Patient will demonstrate evidence of independence with individualized HEP and will report compliance for at least 3 days per week for optimized progression towards remaining therapy goals. Baseline:  Goal status: INITIAL  2.  Patients mother will report a decrease in complaints from pt to less than 6 days per week during community ambulation for improved quality of life. Baseline: 7 days per week Goal status: INITIAL     LONG TERM GOALS: Target date: 09/24/24  Pt will demonstrate decreased bilateral foot  swelling with RICE techniques with help of family for decreased discomfort due to swelling in bilateral feet. Baseline:  bilateral foot swelling reported Goal status: INITIAL  2.  Pt will improve 2 MWT by 40 feet in order to demonstrate improved functional ambulatory capacity in community setting.  Baseline: see objective Goal status: INITIAL  3.  Patients mother will report a decrease in complaints from pt to less than 3 days per week during community ambulation for improved quality of life. Baseline: 7 days per week Goal status: INITIAL  4.  Pt will demonstrate at least 4+/5 MMT for bilateral lower extremity for increased strength during ADL and community ambulation. Baseline: see objective Goal status: INITIAL  5.  Pt will improve SLS by 5 seconds bilaterally in order to improve balance during functional activities. Baseline: See objective Goal status: INITIAL    PLAN:  PT FREQUENCY: 1-2x/week  PT DURATION: 6 weeks  PLANNED INTERVENTIONS: 97110-Therapeutic exercises, 97530- Therapeutic activity, 97112- Neuromuscular re-education, 97535- Self Care, [id number]- Manual therapy, [phone]- Gait training, Patient/Family education, Balance training, Stair training, Taping, Joint mobilization, Joint manipulation, Spinal manipulation, Spinal mobilization, DME instructions, Cryotherapy, and Moist heat  PLAN FOR NEXT SESSION: progress bilateral foot and ankle mobility and strengthening, review RICE techniques, continue manual therapy for decreased swelling and improved mobility   Lang Ada, PT, DPT [facility] Office: [phone] 9:01 AM, 09/16/24  "

## 2024-09-24 ENCOUNTER — Ambulatory Visit (HOSPITAL_COMMUNITY): Payer: MEDICAID

## 2024-09-24 ENCOUNTER — Encounter (HOSPITAL_COMMUNITY): Payer: Self-pay

## 2024-09-24 DIAGNOSIS — M79671 Pain in right foot: Secondary | ICD-10-CM | POA: Diagnosis not present

## 2024-09-24 DIAGNOSIS — Z7409 Other reduced mobility: Secondary | ICD-10-CM

## 2024-09-24 NOTE — Therapy (Signed)
 " OUTPATIENT PHYSICAL THERAPY LOWER EXTREMITY TREATMENT   Patient Name: Patricia Santos MRN: [id number] DOB:April 19, 1991, 34 y.o., female Today's Date: 09/24/2024  END OF SESSION:  PT End of Session - 09/24/24 0730     Visit Number 6    Date for Recertification  09/24/24    Authorization Type [employer]    Authorization Time Period approved 40 visits from 08/13/25-10/10/24([id number])    Authorization - Visit Number 6    Authorization - Number of Visits 40    Progress Note Due on Visit 10    PT Start Time 0730    PT Stop Time 0810    PT Time Calculation (min) 40 min    Activity Tolerance Patient tolerated treatment well    Behavior During Therapy WFL for tasks assessed/performed              Past Medical History:  Diagnosis Date   Arthritis    feet   Autistic disorder    Complication of anesthesia    need to tape eyelids closed during surgery; eyelids do not close completely since ptosis surgery   Constipation    Difficulty swallowing pills    mixes medication with food   History of petit-mal seizures    no seizures since 2007   Mental retardation    Seasonal allergies    Teeth grinding    Urinary incontinence    mom  has patient on a schedule   Past Surgical History:  Procedure Laterality Date   BELPHAROPTOSIS REPAIR Bilateral    HYSTEROSCOPY WITH D & C N/A 10/26/2015   Procedure: DILATATION AND CURETTAGE /HYSTEROSCOPY;  Surgeon: Duwaine Blumenthal, DO;  Location: [facility];  Service: Gynecology;  Laterality: N/A;   INTRAUTERINE DEVICE (IUD) INSERTION N/A 05/02/2013   Procedure: INTRAUTERINE DEVICE (IUD) INSERTION;  Surgeon: Olam Mill, MD;  Location: [facility];  Service: Gynecology;  Laterality: N/A;   IUD REMOVAL N/A 10/20/2015   Procedure: Attempted INTRAUTERINE DEVICE (IUD) REMOVAL with Hysteroscopy;  Surgeon: Duwaine Blumenthal, DO;  Location: [facility];  Service: Gynecology;  Laterality: N/A;   IUD REMOVAL N/A 10/26/2015   Procedure: INTRAUTERINE DEVICE (IUD)  REMOVAL;  Surgeon: Duwaine Blumenthal, DO;  Location: [facility];  Service: Gynecology;  Laterality: N/A;   LAPAROSCOPY N/A 10/26/2015   Procedure: LAPAROSCOPY DIAGNOSTIC with IUD removalwith C Arm;  Surgeon: Duwaine Blumenthal, DO;  Location: [facility];  Service: Gynecology;  Laterality: N/A;   MULTIPLE TOOTH EXTRACTIONS  04/28/2009   #1, #17, #32   MUSCLE BIOPSY     TOENAIL EXCISION Bilateral    for ingrown toenails   TOOTH EXTRACTION N/A 04/08/2014   Procedure: DENTAL RESTORATION/EXTRACTIONS;  Surgeon: Glendia Angles, DDS;  Location: [facility];  Service: Dentistry;  Laterality: N/A;   TOOTH EXTRACTION N/A 07/25/2017   Procedure: EXTRACTION MOLARS NUMBERS 16 AND 31;  Surgeon: Cleotilde Rom, DDS;  Location: [facility];  Service: Oral Surgery;  Laterality: N/A;   TOOTH EXTRACTION N/A 01/06/2022   Procedure: DENTAL RESTORATION/EXTRACTIONS;  Surgeon: Sheryle Glendia, DMD;  Location: [facility];  Service: Oral Surgery;  Laterality: N/A;   WISDOM TOOTH EXTRACTION     Patient Active Problem List   Diagnosis Date Noted   Arthritis 07/10/2017   Autism 07/10/2017   Incontinence 07/10/2017   Intellectual disability 07/10/2017   Constipation 07/11/2011   Hematuria 07/11/2011   Increased frequency of urination 07/11/2011    PCP: Samie Frederick, PA-C   REFERRING PROVIDER: Gershon Donnice SAUNDERS, DPM  REFERRING DIAG:  R26.81 (ICD-10-CM) - Gait instability  THERAPY DIAG:  Bilateral foot pain  Impaired functional mobility, balance, gait, and endurance  Rationale for Evaluation and Treatment: Rehabilitation  ONSET DATE: worsened last 6 months  SUBJECTIVE:   SUBJECTIVE STATEMENT: Pts mother states pt is not complaining nearly as much. Pts mother states they have have been doing the HEP at home.     Eval: Pts mother states pt has flat feet and arthritis has made the feet hurt. Pts mother states she is autistic and has always struggled with walking. States she has noticed her feet swell some and  turn in. Pts mother states she has purchased new balance shoes with inserts. States that podiatry was trying to get AFO brace approved by insurance. Pt has had braces in the past, has not had any in about 10 years. Pt goes to day program everyday in [place] from 10-4.  PERTINENT HISTORY: Low tone Eye lid surgery Autistic PAIN:  Are you having pain? Yes: NPRS scale: unable to report, autistic Pain location: bilateral feet Pain description: hurts Aggravating factors: after a long day of standing Relieving factors: ice pack, patches, compression socks  PRECAUTIONS: None  RED FLAGS: None   WEIGHT BEARING RESTRICTIONS: No  FALLS:  Has patient fallen in last 6 months? No  LIVING ENVIRONMENT: Lives with: lives with their family Lives in: House/apartment Stairs: Yes: External: 1 steps; on right going up Has following equipment at home: None  OCCUPATION: day program attendee  PLOF: Independent with basic ADLs, Independent with transfers, Needs assistance with ADLs, and Needs assistance with homemaking  PATIENT GOALS: decreased the foot pain, be able to walk further, improve gait pattern, for pt to not complain with foot pain  NEXT MD VISIT: next month  OBJECTIVE:  Note: Objective measures were completed at Evaluation unless otherwise noted.  DIAGNOSTIC FINDINGS: Unable to view in chart  PATIENT SURVEYS:  LEFS unable to complete, difficulty communicating with pt  COGNITION: Overall cognitive status: Impaired       EDEMA:  Left foot slight swelling at mid foot and ball of foot  PALPATION: Pt reports pain towards bilateral great toe and metatarsal joints, tenderness reported on bilateral palmar aspect of foot, R worse than L  LOWER EXTREMITY ROM: TBA  Active ROM Right eval Left eval  Hip flexion    Hip extension    Hip abduction    Hip adduction    Hip internal rotation    Hip external rotation    Knee flexion    Knee extension    Ankle dorsiflexion     Ankle plantarflexion    Ankle inversion    Ankle eversion     (Blank rows = not tested)  LOWER EXTREMITY MMT:   MMT Right eval Left eval  Hip flexion 4 4  Hip extension    Hip abduction 4 4  Hip adduction 4 4  Hip internal rotation    Hip external rotation    Knee flexion 4 4  Knee extension 4 4  Ankle dorsiflexion 4- 4-  Ankle plantarflexion 3+ 3+  Ankle inversion 4- 4-  Ankle eversion 4- 4-   (Blank rows = not tested)   FUNCTIONAL TESTS:  5 times sit to stand: TBA 2 minute walk test: 321 feet  SLS R: 2 s L: 5 s  GAIT: Distance walked: 400 feet Assistive device utilized: None Level of assistance: Complete Independence Comments: decreased arm swing, pronated foot collapse bilateral, increased on RLE  TREATMENT DATE:  09/24/2024  Manual Therapy: -Metatarsal and great toe mobilizations, grade II-III, bilaterally -Talocural mobilizations all planes, grade II -Mid foot supination and pronation mobilizations, grade II and III Therapeutic Exercise: -Nustep, 5 minutes, level 4 resistance, pt cued for 75-85 spm -Heel raises, 2 sets of 10 reps, pt cued for increased ROM  -Hamstring curl machine with Dorsiflexion isometric, 2 sets of 10 reps, bilaterally, pt cued for decreased hip ER and eccentric control -Dorsiflexion marches from 6 inch step, 10lb kettle bell, pt cued for max ROM Therapeutic Activity: -Aeromat walks, tandem and lateral stepping, 2 laps in parallel bars, pt requires tactile cues for proper sequencing and tactile cues for decreasing UE support -Forward lunges on bosu ball, 1 set of 10 reps, bilaterally, pt cued for UE support and upright posture -Sled push, 20lbs, pt cued for max ankle ROM, tactile cuing required, 2 laps, 40 feet per lap   09/16/2024  Manual Therapy: -Metatarsal and great toe mobilizations, grade II-III,  bilaterally -STM of bilateral ankle and foot intrinsic musculature Therapeutic Exercise: -Stationary bike, 5 minutes, level 4 resistance, pt cued for 50-60 spm -Toe and heel raises, 2 sets of 10 reps, pt cued for increased ROM  -Dorsiflexion marches from blue foam, 10lb kettle bell, pt cued for max ROM Therapeutic Activity: -Lateral step up and overs, 8 inch step, 1 sets of 5 reps, bilaterally, pt cued for decreased UE support and  -Step ups, increased step length for foot landing clearance -Cone touches from blue foam, 2 sets of 7 reps bilaterally, pt cued for sequencing -Walking marches/butt kicks, toe/heel walks, 1 lap on 20 foot line, tidal tank sphere at chest, pt cued for max ROM and sustained holds -Sit to stands with tidal sphere, with alternating marches, 1 set of 8 reps, pt cued for increased hip ROM  09/05/2024  Manual Therapy: -Metatarsal and great toe mobilizations, grade II-III, bilaterally Therapeutic Exercise: -Stationary bike, 5 minutes, level 3 resistance, pt cued for 50-60 spm -Toe and heel raises, 2 sets of 10 reps, pt cued for increased ROM  -Toe curls and extensions/abductions on towel, 2 sets of 8 reps, pt cued for max ROM (LLE more limited) Neuromuscular Re-education: -Rocker board, DF/PF, 1 set of 20 reps, EV/IN 15 reps, pt requires tactile and verbal cues for proper form  -Forward lunges on 24 inch elevated step, 1 set of 8 reps, bilaterally, pt cued for UE support and upright posture -Resisted lateral stepping walks, 2 laps in parallel bars, pt cued for proper foot alignment -Sled push, 20lbs, pt cued for max ankle ROM, tactile cuing required, 2 laps, 40 feet per lap   PATIENT EDUCATION:  Education details: Pt was educated on findings of PT evaluation, prognosis, frequency of therapy visits and rationale, attendance policy, and HEP if given.   Person educated: Patient and Parent Education method: Explanation, Verbal cues, and Handouts Education comprehension:  verbalized understanding, verbal cues required, tactile cues required, and needs further education  HOME EXERCISE PROGRAM: Access Code: [email or web address] URL: [email or web address] Date: 08/20/2024 Prepared by: Lang Ada  Exercises - Supine Bridge  - 1 x daily - 7 x weekly - 3 sets - 10 reps - Supine Lower Trunk Rotation  - 1 x daily - 7 x weekly - 3 sets - 10 reps - Heel Toe Raises with Counter Support  - 1 x daily - 7 x weekly - 3 sets - 10 reps - Heel Raises with Counter Support  - 1 x daily - 7 x weekly -  3 sets - 10 reps - Single Leg Stance with Support  - 1 x daily - 7 x weekly - 1 sets - 3 reps - 30 hold  ASSESSMENT:  CLINICAL IMPRESSION: Patient continues to demonstrate decreased LE strength/mobility, decreased gait quality and balance. Pt demonstrates increased RLE strength deficits with sled push as she drifts toward the right especially with pulling motion. Patient also demonstrates increased endurance with aerobic based exercise during today's session, able to increased resistance and sustain 75 spm this date. Patient able to progress dynamic balance and core activation exercises today with lunge variations and aeromat walk variations, good performance with verbal cueing. Patient would continue to benefit from skilled physical therapy for increased endurance with ambulation, increased LE strength/mobility, and improved balance for improved quality of life, improved independence with gait training and continued progress towards therapy goals.   Eval: Patient is a 34 y.o. female who was seen today for physical therapy evaluation and treatment for R26.81 (ICD-10-CM) - Gait instability. Patient demonstrates increased pain in bilateral feet, decreased LE strength, abnormal swelling of bilateral feet at base of toes, and impaired gait. Patient also demonstrates difficulty with ambulation during today's session with pronated flat feet without ability to correct and decreased trunk  and UE activation noted. Patient also demonstrates difficulty communicating pain level and specific location of pain due to cognitive conditions. Patient requires tactile and verbal cues for following one step commands, education on role of PT, prognosis, anatomy of foot and overall POC. Patient would benefit from skilled physical therapy for increased endurance with ambulation, increased LE strength/ROM, and balance for improved gait quality, return to higher level of function with ADLs, and progress towards therapy goals.   OBJECTIVE IMPAIRMENTS: Abnormal gait, decreased activity tolerance, decreased balance, decreased coordination, decreased endurance, decreased knowledge of condition, decreased knowledge of use of DME, decreased mobility, difficulty walking, decreased ROM, decreased strength, hypomobility, and pain.   ACTIVITY LIMITATIONS: carrying, lifting, bending, standing, squatting, sleeping, stairs, transfers, and bed mobility  PARTICIPATION LIMITATIONS: meal prep, cleaning, laundry, medication management, personal finances, interpersonal relationship, driving, shopping, community activity, and yard work  PERSONAL FACTORS: Age, Fitness, Past/current experiences, Time since onset of injury/illness/exacerbation, and 1 comorbidity: IDD are also affecting patient's functional outcome.   REHAB POTENTIAL: Fair chronic in nature  CLINICAL DECISION MAKING: Stable/uncomplicated  EVALUATION COMPLEXITY: Low   GOALS: Goals reviewed with patient? No  SHORT TERM GOALS: Target date: 09/03/24  Patient will demonstrate evidence of independence with individualized HEP and will report compliance for at least 3 days per week for optimized progression towards remaining therapy goals. Baseline:  Goal status: INITIAL  2.  Patients mother will report a decrease in complaints from pt to less than 6 days per week during community ambulation for improved quality of life. Baseline: 7 days per week Goal  status: INITIAL     LONG TERM GOALS: Target date: 09/24/24  Pt will demonstrate decreased bilateral foot swelling with RICE techniques with help of family for decreased discomfort due to swelling in bilateral feet. Baseline: bilateral foot swelling reported Goal status: INITIAL  2.  Pt will improve 2 MWT by 40 feet in order to demonstrate improved functional ambulatory capacity in community setting.  Baseline: see objective Goal status: INITIAL  3.  Patients mother will report a decrease in complaints from pt to less than 3 days per week during community ambulation for improved quality of life. Baseline: 7 days per week Goal status: INITIAL  4.  Pt will demonstrate at  least 4+/5 MMT for bilateral lower extremity for increased strength during ADL and community ambulation. Baseline: see objective Goal status: INITIAL  5.  Pt will improve SLS by 5 seconds bilaterally in order to improve balance during functional activities. Baseline: See objective Goal status: INITIAL    PLAN:  PT FREQUENCY: 1-2x/week  PT DURATION: 6 weeks  PLANNED INTERVENTIONS: 97110-Therapeutic exercises, 97530- Therapeutic activity, 97112- Neuromuscular re-education, 97535- Self Care, [id number]- Manual therapy, [phone]- Gait training, Patient/Family education, Balance training, Stair training, Taping, Joint mobilization, Joint manipulation, Spinal manipulation, Spinal mobilization, DME instructions, Cryotherapy, and Moist heat  PLAN FOR NEXT SESSION: progress bilateral foot and ankle mobility and strengthening, review RICE techniques, continue manual therapy for decreased swelling and improved mobility, RLE strengthening   Lang Ada, PT, DPT [facility] Office: [phone] 8:17 AM, 09/24/24  "

## 2024-10-03 ENCOUNTER — Ambulatory Visit (HOSPITAL_COMMUNITY): Payer: MEDICAID

## 2024-10-03 ENCOUNTER — Encounter (HOSPITAL_COMMUNITY): Payer: Self-pay

## 2024-10-03 DIAGNOSIS — M79671 Pain in right foot: Secondary | ICD-10-CM

## 2024-10-03 DIAGNOSIS — Z7409 Other reduced mobility: Secondary | ICD-10-CM

## 2024-10-03 NOTE — Therapy (Signed)
 " OUTPATIENT PHYSICAL THERAPY LOWER EXTREMITY TREATMENT   Patient Name: Patricia Santos MRN: [id number] DOB:16-Jan-1991, 34 y.o., female Today's Date: 10/03/2024  END OF SESSION:  PT End of Session - 10/03/24 0733     Visit Number 7    Date for Recertification  09/24/24    Authorization Type [employer]    Authorization Time Period approved 40 visits from 08/13/25-10/10/24([id number])    Authorization - Visit Number 7    Authorization - Number of Visits 40    Progress Note Due on Visit 10    PT Start Time 0733    PT Stop Time 0811    PT Time Calculation (min) 38 min    Activity Tolerance Patient tolerated treatment well    Behavior During Therapy WFL for tasks assessed/performed              Past Medical History:  Diagnosis Date   Arthritis    feet   Autistic disorder    Complication of anesthesia    need to tape eyelids closed during surgery; eyelids do not close completely since ptosis surgery   Constipation    Difficulty swallowing pills    mixes medication with food   History of petit-mal seizures    no seizures since 2007   Mental retardation    Seasonal allergies    Teeth grinding    Urinary incontinence    mom  has patient on a schedule   Past Surgical History:  Procedure Laterality Date   BELPHAROPTOSIS REPAIR Bilateral    HYSTEROSCOPY WITH D & C N/A 10/26/2015   Procedure: DILATATION AND CURETTAGE /HYSTEROSCOPY;  Surgeon: Duwaine Blumenthal, DO;  Location: [facility];  Service: Gynecology;  Laterality: N/A;   INTRAUTERINE DEVICE (IUD) INSERTION N/A 05/02/2013   Procedure: INTRAUTERINE DEVICE (IUD) INSERTION;  Surgeon: Olam Mill, MD;  Location: [facility];  Service: Gynecology;  Laterality: N/A;   IUD REMOVAL N/A 10/20/2015   Procedure: Attempted INTRAUTERINE DEVICE (IUD) REMOVAL with Hysteroscopy;  Surgeon: Duwaine Blumenthal, DO;  Location: [facility];  Service: Gynecology;  Laterality: N/A;   IUD REMOVAL N/A 10/26/2015   Procedure: INTRAUTERINE DEVICE (IUD)  REMOVAL;  Surgeon: Duwaine Blumenthal, DO;  Location: [facility];  Service: Gynecology;  Laterality: N/A;   LAPAROSCOPY N/A 10/26/2015   Procedure: LAPAROSCOPY DIAGNOSTIC with IUD removalwith C Arm;  Surgeon: Duwaine Blumenthal, DO;  Location: [facility];  Service: Gynecology;  Laterality: N/A;   MULTIPLE TOOTH EXTRACTIONS  04/28/2009   #1, #17, #32   MUSCLE BIOPSY     TOENAIL EXCISION Bilateral    for ingrown toenails   TOOTH EXTRACTION N/A 04/08/2014   Procedure: DENTAL RESTORATION/EXTRACTIONS;  Surgeon: Glendia Angles, DDS;  Location: [facility];  Service: Dentistry;  Laterality: N/A;   TOOTH EXTRACTION N/A 07/25/2017   Procedure: EXTRACTION MOLARS NUMBERS 16 AND 31;  Surgeon: Cleotilde Rom, DDS;  Location: [facility];  Service: Oral Surgery;  Laterality: N/A;   TOOTH EXTRACTION N/A 01/06/2022   Procedure: DENTAL RESTORATION/EXTRACTIONS;  Surgeon: Sheryle Glendia, DMD;  Location: [facility];  Service: Oral Surgery;  Laterality: N/A;   WISDOM TOOTH EXTRACTION     Patient Active Problem List   Diagnosis Date Noted   Arthritis 07/10/2017   Autism 07/10/2017   Incontinence 07/10/2017   Intellectual disability 07/10/2017   Constipation 07/11/2011   Hematuria 07/11/2011   Increased frequency of urination 07/11/2011    PCP: Samie Frederick, PA-C   REFERRING PROVIDER: Gershon Donnice SAUNDERS, DPM  REFERRING DIAG:  R26.81 (ICD-10-CM) - Gait instability  THERAPY DIAG:  Bilateral foot pain  Impaired functional mobility, balance, gait, and endurance  Rationale for Evaluation and Treatment: Rehabilitation  ONSET DATE: worsened last 6 months  SUBJECTIVE:   SUBJECTIVE STATEMENT: Pts mother states pt is not complaining nearly as much. Pts mother states they have have been doing the HEP at home.     Eval: Pts mother states pt has flat feet and arthritis has made the feet hurt. Pts mother states she is autistic and has always struggled with walking. States she has noticed her feet swell some and  turn in. Pts mother states she has purchased new balance shoes with inserts. States that podiatry was trying to get AFO brace approved by insurance. Pt has had braces in the past, has not had any in about 10 years. Pt goes to day program everyday in [place] from 10-4.  PERTINENT HISTORY: Low tone Eye lid surgery Autistic PAIN:  Are you having pain? Yes: NPRS scale: unable to report, autistic Pain location: bilateral feet Pain description: hurts Aggravating factors: after a long day of standing Relieving factors: ice pack, patches, compression socks  PRECAUTIONS: None  RED FLAGS: None   WEIGHT BEARING RESTRICTIONS: No  FALLS:  Has patient fallen in last 6 months? No  LIVING ENVIRONMENT: Lives with: lives with their family Lives in: House/apartment Stairs: Yes: External: 1 steps; on right going up Has following equipment at home: None  OCCUPATION: day program attendee  PLOF: Independent with basic ADLs, Independent with transfers, Needs assistance with ADLs, and Needs assistance with homemaking  PATIENT GOALS: decreased the foot pain, be able to walk further, improve gait pattern, for pt to not complain with foot pain  NEXT MD VISIT: next month  OBJECTIVE:  Note: Objective measures were completed at Evaluation unless otherwise noted.  DIAGNOSTIC FINDINGS: Unable to view in chart  PATIENT SURVEYS:  LEFS unable to complete, difficulty communicating with pt  COGNITION: Overall cognitive status: Impaired       EDEMA:  Left foot slight swelling at mid foot and ball of foot  PALPATION: Pt reports pain towards bilateral great toe and metatarsal joints, tenderness reported on bilateral palmar aspect of foot, R worse than L  LOWER EXTREMITY ROM: TBA  Active ROM Right eval Left eval  Hip flexion    Hip extension    Hip abduction    Hip adduction    Hip internal rotation    Hip external rotation    Knee flexion    Knee extension    Ankle dorsiflexion     Ankle plantarflexion    Ankle inversion    Ankle eversion     (Blank rows = not tested)  LOWER EXTREMITY MMT:   MMT Right eval Left eval  Hip flexion 4 4  Hip extension    Hip abduction 4 4  Hip adduction 4 4  Hip internal rotation    Hip external rotation    Knee flexion 4 4  Knee extension 4 4  Ankle dorsiflexion 4- 4-  Ankle plantarflexion 3+ 3+  Ankle inversion 4- 4-  Ankle eversion 4- 4-   (Blank rows = not tested)   FUNCTIONAL TESTS:  5 times sit to stand: TBA 2 minute walk test: 321 feet  SLS R: 2 s L: 5 s  GAIT: Distance walked: 400 feet Assistive device utilized: None Level of assistance: Complete Independence Comments: decreased arm swing, pronated foot collapse bilateral, increased on RLE  TREATMENT DATE:  10/03/2024  Manual Therapy: -Metatarsal and great toe mobilizations, grade II-III, bilaterally -Talocural mobilizations all planes, grade II -Mid foot supination and pronation mobilizations, grade II and III Therapeutic Exercise: -Treadmill, 5 minutes, level 2.0 grade, 1.2>1.5>2.0 speed -Heel/toe raises, 2 sets of 10 reps each variation, pt cued for increased ROM  -Rockerboard, 2 sets of 20 reps, IV/EV, and PF/DF, pt cued with mirror for proper form -TKE, 6 inch step, 1 set of 15 reps bilaterally, pt cued for max knee extension Therapeutic Activity: -Single leg RDL with 5lb kettle bell, 1 set of 10 reps bilaterally, pt cued with mirror and for core activtion -Forward lunges on bosu ball, 1 set of 10 reps, bilaterally, pt cued for UE support and upright posture -STS with calf raises with 5lb kettle bell, pt cued for max ROM of ankles, and hip extension at top  09/24/2024  Manual Therapy: -Metatarsal and great toe mobilizations, grade II-III, bilaterally -Talocural mobilizations all planes, grade II -Mid foot supination and  pronation mobilizations, grade II and III Therapeutic Exercise: -Nustep, 5 minutes, level 4 resistance, pt cued for 75-85 spm -Heel raises, 2 sets of 10 reps, pt cued for increased ROM  -Hamstring curl machine with Dorsiflexion isometric, 2 sets of 10 reps, bilaterally, pt cued for decreased hip ER and eccentric control -Dorsiflexion marches from 6 inch step, 10lb kettle bell, pt cued for max ROM Therapeutic Activity: -Aeromat walks, tandem and lateral stepping, 2 laps in parallel bars, pt requires tactile cues for proper sequencing and tactile cues for decreasing UE support -Forward lunges on bosu ball, 1 set of 10 reps, bilaterally, pt cued for UE support and upright posture -Sled push, 20lbs, pt cued for max ankle ROM, tactile cuing required, 2 laps, 40 feet per lap   09/16/2024  Manual Therapy: -Metatarsal and great toe mobilizations, grade II-III, bilaterally -STM of bilateral ankle and foot intrinsic musculature Therapeutic Exercise: -Stationary bike, 5 minutes, level 4 resistance, pt cued for 50-60 spm -Toe and heel raises, 2 sets of 10 reps, pt cued for increased ROM  -Dorsiflexion marches from blue foam, 10lb kettle bell, pt cued for max ROM Therapeutic Activity: -Lateral step up and overs, 8 inch step, 1 sets of 5 reps, bilaterally, pt cued for decreased UE support and  -Step ups, increased step length for foot landing clearance -Cone touches from blue foam, 2 sets of 7 reps bilaterally, pt cued for sequencing -Walking marches/butt kicks, toe/heel walks, 1 lap on 20 foot line, tidal tank sphere at chest, pt cued for max ROM and sustained holds -Sit to stands with tidal sphere, with alternating marches, 1 set of 8 reps, pt cued for increased hip ROM  PATIENT EDUCATION:  Education details: Pt was educated on findings of PT evaluation, prognosis, frequency of therapy visits and rationale, attendance policy, and HEP if given.   Person educated: Patient and Parent Education method:  Explanation, Verbal cues, and Handouts Education comprehension: verbalized understanding, verbal cues required, tactile cues required, and needs further education  HOME EXERCISE PROGRAM: Access Code: [email or web address] URL: [email or web address] Date: 08/20/2024 Prepared by: Lang Ada  Exercises - Supine Bridge  - 1 x daily - 7 x weekly - 3 sets - 10 reps - Supine Lower Trunk Rotation  - 1 x daily - 7 x weekly - 3 sets - 10 reps - Heel Toe Raises with Counter Support  - 1 x daily - 7 x weekly - 3 sets - 10 reps - Heel Raises with Counter Support  -  1 x daily - 7 x weekly - 3 sets - 10 reps - Single Leg Stance with Support  - 1 x daily - 7 x weekly - 1 sets - 3 reps - 30 hold  ASSESSMENT:  CLINICAL IMPRESSION: Patient continues to demonstrate improved LE strength/mobility, increased gait quality and balance. Pt demonstrates ability to heel strike with minimal verbal cues today on treadmill. Patient also demonstrates increased activity tolerance with SLS and strength training with kettle bell. Patient able to progress dynamic balance and core activation exercises today with lunge variations and treadmill, good performance with verbal cueing. Patient would continue to benefit from skilled physical therapy for increased endurance with ambulation, increased LE strength/mobility, and improved balance for improved quality of life, improved independence with gait training and continued progress towards therapy goals.   Eval: Patient is a 34 y.o. female who was seen today for physical therapy evaluation and treatment for R26.81 (ICD-10-CM) - Gait instability. Patient demonstrates increased pain in bilateral feet, decreased LE strength, abnormal swelling of bilateral feet at base of toes, and impaired gait. Patient also demonstrates difficulty with ambulation during today's session with pronated flat feet without ability to correct and decreased trunk and UE activation noted. Patient also  demonstrates difficulty communicating pain level and specific location of pain due to cognitive conditions. Patient requires tactile and verbal cues for following one step commands, education on role of PT, prognosis, anatomy of foot and overall POC. Patient would benefit from skilled physical therapy for increased endurance with ambulation, increased LE strength/ROM, and balance for improved gait quality, return to higher level of function with ADLs, and progress towards therapy goals.   OBJECTIVE IMPAIRMENTS: Abnormal gait, decreased activity tolerance, decreased balance, decreased coordination, decreased endurance, decreased knowledge of condition, decreased knowledge of use of DME, decreased mobility, difficulty walking, decreased ROM, decreased strength, hypomobility, and pain.   ACTIVITY LIMITATIONS: carrying, lifting, bending, standing, squatting, sleeping, stairs, transfers, and bed mobility  PARTICIPATION LIMITATIONS: meal prep, cleaning, laundry, medication management, personal finances, interpersonal relationship, driving, shopping, community activity, and yard work  PERSONAL FACTORS: Age, Fitness, Past/current experiences, Time since onset of injury/illness/exacerbation, and 1 comorbidity: IDD are also affecting patient's functional outcome.   REHAB POTENTIAL: Fair chronic in nature  CLINICAL DECISION MAKING: Stable/uncomplicated  EVALUATION COMPLEXITY: Low   GOALS: Goals reviewed with patient? No  SHORT TERM GOALS: Target date: 09/03/24  Patient will demonstrate evidence of independence with individualized HEP and will report compliance for at least 3 days per week for optimized progression towards remaining therapy goals. Baseline:  Goal status: INITIAL  2.  Patients mother will report a decrease in complaints from pt to less than 6 days per week during community ambulation for improved quality of life. Baseline: 7 days per week Goal status: INITIAL     LONG TERM GOALS:  Target date: 09/24/24  Pt will demonstrate decreased bilateral foot swelling with RICE techniques with help of family for decreased discomfort due to swelling in bilateral feet. Baseline: bilateral foot swelling reported Goal status: INITIAL  2.  Pt will improve 2 MWT by 40 feet in order to demonstrate improved functional ambulatory capacity in community setting.  Baseline: see objective Goal status: INITIAL  3.  Patients mother will report a decrease in complaints from pt to less than 3 days per week during community ambulation for improved quality of life. Baseline: 7 days per week Goal status: INITIAL  4.  Pt will demonstrate at least 4+/5 MMT for bilateral lower extremity for  increased strength during ADL and community ambulation. Baseline: see objective Goal status: INITIAL  5.  Pt will improve SLS by 5 seconds bilaterally in order to improve balance during functional activities. Baseline: See objective Goal status: INITIAL    PLAN:  PT FREQUENCY: 1-2x/week  PT DURATION: 6 weeks  PLANNED INTERVENTIONS: 97110-Therapeutic exercises, 97530- Therapeutic activity, 97112- Neuromuscular re-education, 97535- Self Care, [id number]- Manual therapy, [phone]- Gait training, Patient/Family education, Balance training, Stair training, Taping, Joint mobilization, Joint manipulation, Spinal manipulation, Spinal mobilization, DME instructions, Cryotherapy, and Moist heat  PLAN FOR NEXT SESSION: progress bilateral foot and ankle mobility and strengthening, review RICE techniques, continue manual therapy for decreased swelling and improved mobility, RLE strengthening   Lang Ada, PT, DPT [facility] Office: [phone] 8:29 AM, 10/03/24  "

## 2024-10-08 ENCOUNTER — Ambulatory Visit (HOSPITAL_COMMUNITY): Payer: MEDICAID

## 2024-10-14 ENCOUNTER — Ambulatory Visit (HOSPITAL_COMMUNITY): Payer: MEDICAID

## 2024-10-23 ENCOUNTER — Ambulatory Visit (HOSPITAL_COMMUNITY): Payer: MEDICAID

## 2024-10-28 ENCOUNTER — Ambulatory Visit (HOSPITAL_COMMUNITY): Payer: MEDICAID

## 2024-11-06 ENCOUNTER — Ambulatory Visit (HOSPITAL_COMMUNITY): Payer: MEDICAID

## 2025-09-15 ENCOUNTER — Ambulatory Visit: Payer: MEDICAID | Admitting: Podiatry
